# Patient Record
Sex: Female | Born: 1995 | Race: Black or African American | Hispanic: No | Marital: Single | Smoking: Never smoker
Health system: Southern US, Community
[De-identification: ages and names within clinical notes are randomized; demographics above are authoritative.]

## PROBLEM LIST (undated history)

## (undated) HISTORY — PX: WISDOM TOOTH EXTRACTION: SHX21

---
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
## 2015-09-27 ENCOUNTER — Emergency Department
Admission: EM | Admit: 2015-09-27 | Discharge: 2015-09-27 | Disposition: A | Discharge status: Home / Self Care | Payer: Medicaid Other | Attending: Emergency Medicine | Admitting: Emergency Medicine

## 2015-09-27 ENCOUNTER — Encounter: Payer: Self-pay | Admitting: *Deleted

## 2015-09-27 DIAGNOSIS — Z880 Allergy status to penicillin: Secondary | ICD-10-CM | POA: Insufficient documentation

## 2015-09-27 DIAGNOSIS — F419 Anxiety disorder, unspecified: Secondary | ICD-10-CM | POA: Insufficient documentation

## 2015-09-27 DIAGNOSIS — R42 Dizziness and giddiness: Secondary | ICD-10-CM | POA: Insufficient documentation

## 2015-09-27 LAB — BASIC METABOLIC PANEL
Anion gap: 4 — ABNORMAL LOW (ref 5–15)
BUN: 11 mg/dL (ref 6–20)
CALCIUM: 8.3 mg/dL — AB (ref 8.9–10.3)
CO2: 24 mmol/L (ref 22–32)
CREATININE: 1.03 mg/dL — AB (ref 0.44–1.00)
Chloride: 111 mmol/L (ref 101–111)
GFR calc Af Amer: >60 mL/min (ref >60)
GFR calc non Af Amer: >60 mL/min (ref >60)
GLUCOSE: 101 mg/dL — AB (ref 65–99)
Potassium: 4.1 mmol/L (ref 3.5–5.1)
Sodium: 139 mmol/L (ref 135–145)

## 2015-09-27 LAB — CBC
HCT: 39.6 % (ref 35.0–47.0)
HEMOGLOBIN: 12.8 g/dL (ref 12.0–16.0)
MCH: 27.6 pg (ref 26.0–34.0)
MCHC: 32.4 g/dL (ref 32.0–36.0)
MCV: 85.3 fL (ref 80.0–100.0)
PLATELETS: 205 10*3/uL (ref 150–440)
RBC: 4.64 MIL/uL (ref 3.80–5.20)
RDW: 12.4 % (ref 11.5–14.5)
WBC: 5 10*3/uL (ref 3.6–11.0)

## 2015-09-27 NOTE — ED Provider Notes (Signed)
[facility] Emergency Department Provider Note  ____________________________________________  Time seen: On arrival  I have reviewed the triage vital signs and the nursing notes.   HISTORY  Chief Complaint Weakness    HPI [name] is a 19 y.o. female who reports feeling weak and dizzy. She notes that she gave plasma earlier today and since then has felt fatigued. She is worried that she is often much blood. She denies chest pain or palpitations. She notes that she has felt dizzy when standing rapidly but is fine lying in bed. She is never experienced this before after donating plasma which she apparently does relatively regularly     No past medical history on file.  There are no active problems to display for this patient.   No past surgical history on file.  No current outpatient prescriptions on file.  Allergies Penicillins  No family history on file.  Social History Social History  Substance Use Topics  . Smoking status: Never Smoker   . Smokeless tobacco: Not on file  . Alcohol Use: No    Review of Systems  Constitutional: Negative for fever. Eyes: Negative for visual changes. ENT: Negative for sore throat Cardiovascular: Negative for chest pain. Respiratory: Negative for shortness of breath. Gastrointestinal: Negative for abdominal pain, vomiting and diarrhea. Genitourinary: Negative for dysuria. Musculoskeletal: Negative for back pain. Skin: Negative for rash. Neurological: Negative for headaches or focal weakness Psychiatric:mild anxiety    ____________________________________________   PHYSICAL EXAM:  VITAL SIGNS: ED Triage Vitals  Enc Vitals Group     BP 09/27/15 2024 124/69 mmHg     Pulse Rate 09/27/15 2024 87     Resp 09/27/15 2024 20     Temp 09/27/15 2024 98.5 F (36.9 C)     Temp Source 09/27/15 2024 Oral     SpO2 09/27/15 2024 98 %     Weight 09/27/15 2024 170 lb (77.111 kg)     Height 09/27/15  2024 5\' 5"  (1.651 m)     Head Cir --      Peak Flow --      Pain Score 09/27/15 2119 7     Pain Loc --      Pain Edu? --      Excl. in GC? --      Constitutional: Alert and oriented. Well appearing and in no distress. Eyes: Conjunctivae are normal.  ENT   Head: Normocephalic and atraumatic.   Mouth/Throat: Mucous membranes are moist. Cardiovascular: Normal rate, regular rhythm. Normal and symmetric distal pulses are present in all extremities. No murmurs, rubs, or gallops. Respiratory: Normal respiratory effort without tachypnea nor retractions. Breath sounds are clear and equal bilaterally.  Gastrointestinal: Soft and non-tender in all quadrants. No distention. There is no CVA tenderness. Genitourinary: deferred Musculoskeletal: Nontender with normal range of motion in all extremities. No lower extremity tenderness nor edema. Neurologic:  Normal speech and language. No gross focal neurologic deficits are appreciated. Skin:  Skin is warm, dry and intact. No rash noted. Psychiatric: Mood and affect are normal. Patient exhibits appropriate insight and judgment.  ____________________________________________    LABS (pertinent positives/negatives)  Labs Reviewed  BASIC METABOLIC PANEL - Abnormal; Notable for the following:    Glucose, Bld 101 (*)    Creatinine, Ser 1.03 (*)    Calcium 8.3 (*)    Anion gap 4 (*)    All other components within normal limits  CBC    ____________________________________________   EKG  None  ____________________________________________  RADIOLOGY I have personally reviewed any xrays that were ordered on this patient: None  ____________________________________________   PROCEDURES  Procedure(s) performed: none  Critical Care performed: none  ____________________________________________   INITIAL IMPRESSION / ASSESSMENT AND PLAN / ED COURSE  Pertinent labs & imaging results that were available during my care of the  patient were reviewed by me and considered in my medical decision making (see chart for details).  Patient well-appearing and in no distress. Vital signs are normal here. Dizziness likely related to blood product donation today.She is not tachycardic and her blood pressure is normal. I do not think labs are necessary. Reassurance provided and I encouraged the patient to drink orange juice and eat  ____________________________________________   FINAL CLINICAL IMPRESSION(S) / ED DIAGNOSES  Final diagnoses:  Dizziness     Jene Every, MD 09/27/15 2312

## 2015-09-27 NOTE — ED Notes (Signed)
Pt reports giving blood today and now feels weak and feels faint.  Pt gave blood at 1400 today.  No n/v/d.  Pt alert.  Speech clear.

## 2015-09-27 NOTE — Discharge Instructions (Signed)

## 2015-12-04 ENCOUNTER — Emergency Department
Admission: EM | Admit: 2015-12-04 | Discharge: 2015-12-04 | Disposition: A | Discharge status: Home / Self Care | Payer: Medicaid Other | Attending: Emergency Medicine | Admitting: Emergency Medicine

## 2015-12-04 ENCOUNTER — Encounter: Payer: Self-pay | Admitting: Emergency Medicine

## 2015-12-04 DIAGNOSIS — Z880 Allergy status to penicillin: Secondary | ICD-10-CM | POA: Insufficient documentation

## 2015-12-04 DIAGNOSIS — J0100 Acute maxillary sinusitis, unspecified: Secondary | ICD-10-CM | POA: Insufficient documentation

## 2015-12-04 DIAGNOSIS — Z79899 Other long term (current) drug therapy: Secondary | ICD-10-CM | POA: Insufficient documentation

## 2015-12-04 MED ORDER — SULFAMETHOXAZOLE-TRIMETHOPRIM 800-160 MG PO TABS: 1.0000 | ORAL_TABLET | Freq: Two times a day (BID) | ORAL | Status: DC

## 2015-12-04 MED ORDER — BENZONATATE 100 MG PO CAPS: 100.0000 mg | ORAL_CAPSULE | Freq: Three times a day (TID) | ORAL | Status: DC | PRN

## 2015-12-04 MED ORDER — IBUPROFEN 800 MG PO TABS: 800.0000 mg | ORAL_TABLET | Freq: Three times a day (TID) | ORAL | Status: DC | PRN

## 2015-12-04 MED ORDER — FEXOFENADINE-PSEUDOEPHED ER 60-120 MG PO TB12: 1.0000 | ORAL_TABLET | Freq: Two times a day (BID) | ORAL | Status: DC

## 2015-12-04 NOTE — ED Provider Notes (Signed)
[facility] Emergency Department Provider Note  ____________________________________________  Time seen: Approximately 12:55 PM  I have reviewed the triage vital signs and the nursing notes.   HISTORY  Chief Complaint Cough    HPI [name] is a 19 y.o. female patient complaining of nasal congestion, cough,chest congestion and sneezing and body aches for 3 weeks. Patient states she is tired using over-the-counter cold preparation which has not helped.He denies any fever associated this complaint patient denies any nausea vomiting or diarrhea.   History reviewed. No pertinent past medical history.  There are no active problems to display for this patient.   History reviewed. No pertinent past surgical history.  Current Outpatient Rx  Name  Route  Sig  Dispense  Refill  . benzonatate (TESSALON PERLES) 100 MG capsule   Oral   Take 1 capsule (100 mg total) by mouth 3 (three) times daily as needed for cough.   15 capsule   0   . fexofenadine-pseudoephedrine (ALLEGRA-D) 60-120 MG 12 hr tablet   Oral   Take 1 tablet by mouth 2 (two) times daily.   30 tablet   0   . ibuprofen (ADVIL,MOTRIN) 800 MG tablet   Oral   Take 1 tablet (800 mg total) by mouth every 8 (eight) hours as needed.   30 tablet   0   . sulfamethoxazole-trimethoprim (BACTRIM DS,SEPTRA DS) 800-160 MG tablet   Oral   Take 1 tablet by mouth 2 (two) times daily.   20 tablet   0     Allergies Penicillins  History reviewed. No pertinent family history.  Social History Social History  Substance Use Topics  . Smoking status: Never Smoker   . Smokeless tobacco: None  . Alcohol Use: No  Review of Systems Constitutional: No fever/chills Eyes: No visual changes. ENT: Sore throat. Nasal congestion and thick nasal discharge. Cardiovascular: Denies chest pain. Respiratory: Denies shortness of breath. Gastrointestinal: No abdominal pain.  No nausea, no vomiting.  No  diarrhea.  No constipation. Genitourinary: Negative for dysuria. Musculoskeletal: Negative for back pain. Skin: Negative for rash. Neurological: Negative for headaches, focal weakness or numbness. 0-point ROS otherwise negative.  ____________________________________________   PHYSICAL EXAM:  VITAL SIGNS: ED Triage Vitals  Enc Vitals Group     BP 12/04/15 1212 127/68 mmHg     Pulse Rate 12/04/15 1212 84     Resp 12/04/15 1212 20     Temp 12/04/15 1212 98.4 F (36.9 C)     Temp Source 12/04/15 1212 Oral     SpO2 12/04/15 1212 100 %     Weight 12/04/15 1212 178 lb (80.74 kg)     Height 12/04/15 1212 5\' 6"  (1.676 m)     Head Cir --      Peak Flow --      Pain Score 12/04/15 1213 7     Pain Loc --      Pain Edu? --      Excl. in GC? --    Constitutional: Alert and oriented. Well appearing and in no acute distress. Eyes: Conjunctivae are normal. PERRL. EOMI. Head: Atraumatic. Nose: Bilateral maxillary guarding. Bilaterally edematous nasal turbinates. Thick nasal discharge Mouth/Throat: Mucous membranes are moist.  Oropharynx erythematous. Neck: No stridor.  No cervical spine tenderness to palpation. Hematological/Lymphatic/Immunilogical: No cervical lymphadenopathy. Cardiovascular: Normal rate, regular rhythm. Grossly normal heart sounds.  Good peripheral circulation. Respiratory: Normal respiratory effort.  No retractions. Lungs CTAB. Gastrointestinal: Soft and nontender. No distention. No abdominal bruits.  No CVA tenderness. Musculoskeletal: No lower extremity tenderness nor edema.  No joint effusions. Neurologic:  Normal speech and language. No gross focal neurologic deficits are appreciated. No gait instability. Skin:  Skin is warm, dry and intact. No rash noted. Psychiatric: Mood and affect are normal. Speech and behavior are normal.  ____________________________________________   LABS (all labs ordered are listed, but only abnormal results are displayed)  Labs  Reviewed - No data to display ____________________________________________  EKG   ____________________________________________  RADIOLOGY   ____________________________________________   PROCEDURES  Procedure(s) performed: None  Critical Care performed: No  ____________________________________________   INITIAL IMPRESSION / ASSESSMENT AND PLAN / ED COURSE  Pertinent labs & imaging results that were available during my care of the patient were reviewed by me and considered in my medical decision making (see chart for details).  Bilateral maxillary sinusitis. She is given discharge care instructions. Patient given a prescription for Bactrim, Allegra-D, ibuprofen, and Tessalon Perles. Advised to [facility]. ____________________________________________   FINAL CLINICAL IMPRESSION(S) / ED DIAGNOSES  Final diagnoses:  Subacute maxillary sinusitis      Joni Reining, PA-C 12/04/15 1306  Jeanmarie Plant, MD 12/07/15 1041

## 2015-12-04 NOTE — ED Notes (Signed)
Reports cough, congestion, sneezing, and bodyaches x 3 weeks.  No resp ditress at this time.

## 2015-12-04 NOTE — Discharge Instructions (Signed)
Sinusitis, Adult  Sinusitis is redness, soreness, and puffiness (inflammation) of the air pockets in the bones of your face (sinuses). The redness, soreness, and puffiness can cause air and mucus to get trapped in your sinuses. This can allow germs to grow and cause an infection.   HOME CARE    Drink enough fluids to keep your pee (urine) clear or pale yellow.   Use a humidifier in your home.   Run a hot shower to create steam in the bathroom. Sit in the bathroom with the door closed. Breathe in the steam 3-4 times a day.   Put a warm, moist washcloth on your face 3-4 times a day, or as told by your doctor.   Use salt water sprays (saline sprays) to wet the thick fluid in your nose. This can help the sinuses drain.   Only take medicine as told by your doctor.  GET HELP RIGHT AWAY IF:    Your pain gets worse.   You have very bad headaches.   You are sick to your stomach (nauseous).   You throw up (vomit).   You are very sleepy (drowsy) all the time.   Your face is puffy (swollen).   Your vision changes.   You have a stiff neck.   You have trouble breathing.  MAKE SURE YOU:    Understand these instructions.   Will watch your condition.   Will get help right away if you are not doing well or get worse.     This information is not intended to replace advice given to you by your health care provider. Make sure you discuss any questions you have with your health care provider.     Document Released: 05/08/2008 Document Revised: 12/11/2014 Document Reviewed: 06/25/2012  Elsevier Interactive Patient Education 2016 [employer]

## 2015-12-25 ENCOUNTER — Emergency Department
Admission: EM | Admit: 2015-12-25 | Discharge: 2015-12-25 | Disposition: A | Discharge status: Home / Self Care | Payer: Medicaid Other | Attending: Student | Admitting: Student

## 2015-12-25 ENCOUNTER — Encounter: Payer: Self-pay | Admitting: Emergency Medicine

## 2015-12-25 DIAGNOSIS — Z880 Allergy status to penicillin: Secondary | ICD-10-CM | POA: Diagnosis not present

## 2015-12-25 DIAGNOSIS — M79604 Pain in right leg: Secondary | ICD-10-CM | POA: Diagnosis present

## 2015-12-25 DIAGNOSIS — M79652 Pain in left thigh: Secondary | ICD-10-CM | POA: Insufficient documentation

## 2015-12-25 DIAGNOSIS — Z79899 Other long term (current) drug therapy: Secondary | ICD-10-CM | POA: Insufficient documentation

## 2015-12-25 DIAGNOSIS — Z792 Long term (current) use of antibiotics: Secondary | ICD-10-CM | POA: Diagnosis not present

## 2015-12-25 DIAGNOSIS — M79651 Pain in right thigh: Secondary | ICD-10-CM | POA: Insufficient documentation

## 2015-12-25 LAB — COMPREHENSIVE METABOLIC PANEL
ALBUMIN: 4.4 g/dL (ref 3.5–5.0)
ALT: 24 U/L (ref 14–54)
AST: 31 U/L (ref 15–41)
Alkaline Phosphatase: 83 U/L (ref 38–126)
Anion gap: 5 (ref 5–15)
BILIRUBIN TOTAL: 0.4 mg/dL (ref 0.3–1.2)
BUN: 15 mg/dL (ref 6–20)
CHLORIDE: 108 mmol/L (ref 101–111)
CO2: 27 mmol/L (ref 22–32)
CREATININE: 0.71 mg/dL (ref 0.44–1.00)
Calcium: 9.5 mg/dL (ref 8.9–10.3)
GFR calc Af Amer: >60 mL/min (ref >60)
GFR calc non Af Amer: >60 mL/min (ref >60)
GLUCOSE: 68 mg/dL (ref 65–99)
POTASSIUM: 4.4 mmol/L (ref 3.5–5.1)
Sodium: 140 mmol/L (ref 135–145)
TOTAL PROTEIN: 8.2 g/dL — AB (ref 6.5–8.1)

## 2015-12-25 LAB — URINALYSIS COMPLETE WITH MICROSCOPIC (ARMC ONLY)
Bilirubin Urine: NEGATIVE
Glucose, UA: NEGATIVE mg/dL
Hgb urine dipstick: NEGATIVE
Ketones, ur: NEGATIVE mg/dL
LEUKOCYTES UA: NEGATIVE
Nitrite: NEGATIVE
PH: 6 (ref 5.0–8.0)
PROTEIN: NEGATIVE mg/dL
Specific Gravity, Urine: 1.019 (ref 1.005–1.030)

## 2015-12-25 LAB — CBC WITH DIFFERENTIAL/PLATELET
BASOS ABS: 0 10*3/uL (ref 0–0.1)
BASOS PCT: 0 %
Eosinophils Absolute: 0 10*3/uL (ref 0–0.7)
Eosinophils Relative: 1 %
HEMATOCRIT: 38.4 % (ref 35.0–47.0)
HEMOGLOBIN: 12.6 g/dL (ref 12.0–16.0)
Lymphocytes Relative: 36 %
Lymphs Abs: 1.2 10*3/uL (ref 1.0–3.6)
MCH: 27 pg (ref 26.0–34.0)
MCHC: 32.9 g/dL (ref 32.0–36.0)
MCV: 82 fL (ref 80.0–100.0)
MONO ABS: 0.4 10*3/uL (ref 0.2–0.9)
Monocytes Relative: 12 %
NEUTROS ABS: 1.6 10*3/uL (ref 1.4–6.5)
NEUTROS PCT: 51 %
Platelets: 236 10*3/uL (ref 150–440)
RBC: 4.68 MIL/uL (ref 3.80–5.20)
RDW: 12.6 % (ref 11.5–14.5)
WBC: 3.3 10*3/uL — AB (ref 3.6–11.0)

## 2015-12-25 MED ORDER — SODIUM CHLORIDE 0.9 % IV BOLUS (SEPSIS)
1000.0000 mL | Freq: Once | INTRAVENOUS | Status: AC
  Administered 2015-12-25: 1000 mL via INTRAVENOUS

## 2015-12-25 MED ORDER — NAPROXEN 500 MG PO TABS: 500.0000 mg | ORAL_TABLET | Freq: Two times a day (BID) | ORAL | Status: DC

## 2015-12-25 MED ORDER — KETOROLAC TROMETHAMINE 30 MG/ML IJ SOLN
30.0000 mg | Freq: Once | INTRAMUSCULAR | Status: AC
  Administered 2015-12-25: 30 mg via INTRAVENOUS
  Filled 2015-12-25: qty 1

## 2015-12-25 NOTE — Discharge Instructions (Signed)
Musculoskeletal Pain Musculoskeletal pain is muscle and boney aches and pains. These pains can occur in any part of the body. Your caregiver may treat you without knowing the cause of the pain. They may treat you if blood or urine tests, X-rays, and other tests were normal.  CAUSES There is often not a definite cause or reason for these pains. These pains may be caused by a type of germ (virus). The discomfort may also come from overuse. Overuse includes working out too hard when your body is not fit. Boney aches also come from weather changes. Bone is sensitive to atmospheric pressure changes. HOME CARE INSTRUCTIONS   Ask when your test results will be ready. Make sure you get your test results.  Only take over-the-counter or prescription medicines for pain, discomfort, or fever as directed by your caregiver. If you were given medications for your condition, do not drive, operate machinery or power tools, or sign legal documents for 24 hours. Do not drink alcohol. Do not take sleeping pills or other medications that may interfere with treatment.  Continue all activities unless the activities cause more pain. When the pain lessens, slowly resume normal activities. Gradually increase the intensity and duration of the activities or exercise.  During periods of severe pain, bed rest may be helpful. Lay or sit in any position that is comfortable.  Putting ice on the injured area.  Put ice in a bag.  Place a towel between your skin and the bag.  Leave the ice on for 15 to 20 minutes, 3 to 4 times a day.  Follow up with your caregiver for continued problems and no reason can be found for the pain. If the pain becomes worse or does not go away, it may be necessary to repeat tests or do additional testing. Your caregiver may need to look further for a possible cause. SEEK IMMEDIATE MEDICAL CARE IF:  You have pain that is getting worse and is not relieved by medications.  You develop chest pain  that is associated with shortness or breath, sweating, feeling sick to your stomach (nauseous), or throw up (vomit).  Your pain becomes localized to the abdomen.  You develop any new symptoms that seem different or that concern you. MAKE SURE YOU:   Understand these instructions.  Will watch your condition.  Will get help right away if you are not doing well or get worse.   This information is not intended to replace advice given to you by your health care provider. Make sure you discuss any questions you have with your health care provider.   Document Released: 11/20/2005 Document Revised: 02/12/2012 Document Reviewed: 07/25/2013 Elsevier Interactive Patient Education 2016 [employer].   Follow-up with [place] [facility] if any continued problems. Begin taking naproxen twice a day with food and discontinue exercise at this time. You may do upper body exercises only. Try ice or heat to your legs to see if this helps with your discomfort.

## 2015-12-25 NOTE — ED Notes (Signed)
Pt reports has been working out every day over last week.  Has been having pain in thighs since then but reports the pain hasn't seemed to ease.  Also started having pain in right knee; previously has acl repair in this knee.

## 2015-12-25 NOTE — ED Provider Notes (Signed)
[facility] Emergency Department Provider Note  ____________________________________________  Time seen: Approximately 1:13 PM  I have reviewed the triage vital signs and the nursing notes.   HISTORY  Chief Complaint Leg Pain  HPI [name] is a 20 y.o. female complaint of bilateral leg pain. Patient states that she is having great deal of pain in her thighs bilaterally without any known injury. Patient states that she has a exercise program that she does for 30 minutes every day consisting of squats and one just. She has been taking over-the-counter ibuprofen without any relief.She rates her pain as 3 out of 10 however when she tries to walk is 10 over 10.   History reviewed. No pertinent past medical history.  There are no active problems to display for this patient.   History reviewed. No pertinent past surgical history.  Current Outpatient Rx  Name  Route  Sig  Dispense  Refill  . benzonatate (TESSALON PERLES) 100 MG capsule   Oral   Take 1 capsule (100 mg total) by mouth 3 (three) times daily as needed for cough.   15 capsule   0   . fexofenadine-pseudoephedrine (ALLEGRA-D) 60-120 MG 12 hr tablet   Oral   Take 1 tablet by mouth 2 (two) times daily.   30 tablet   0   . naproxen (NAPROSYN) 500 MG tablet   Oral   Take 1 tablet (500 mg total) by mouth 2 (two) times daily with a meal.   30 tablet   0   . sulfamethoxazole-trimethoprim (BACTRIM DS,SEPTRA DS) 800-160 MG tablet   Oral   Take 1 tablet by mouth 2 (two) times daily.   20 tablet   0     Allergies Penicillins  History reviewed. No pertinent family history.  Social History Social History  Substance Use Topics  . Smoking status: Never Smoker   . Smokeless tobacco: None  . Alcohol Use: No    Review of Systems Constitutional: No fever/chills ENT: No sore throat. Cardiovascular: Denies chest pain. Respiratory: Denies shortness of breath. Gastrointestinal: No  abdominal pain.  No nausea, no vomiting.  No diarrhea.  No constipation. Genitourinary: Negative for dysuria. Musculoskeletal: Negative for back pain. Also bilateral leg pain Skin: Negative for rash. Neurological: Negative for headaches, focal weakness or numbness.  10-point ROS otherwise negative.  ____________________________________________   PHYSICAL EXAM:  VITAL SIGNS: ED Triage Vitals  Enc Vitals Group     BP 12/25/15 1300 131/69 mmHg     Pulse Rate 12/25/15 1300 86     Resp 12/25/15 1300 16     Temp 12/25/15 1300 98.3 F (36.8 C)     Temp Source 12/25/15 1300 Oral     SpO2 12/25/15 1300 97 %     Weight 12/25/15 1300 170 lb (77.111 kg)     Height 12/25/15 1300  (1.676 m)     Head Cir --      Peak Flow --      Pain Score 12/25/15 1300 3     Pain Loc --      Pain Edu? --      Excl. in GC? --     Constitutional: Alert and oriented. Well appearing and in no acute distress. Eyes: Conjunctivae are normal. PERRL. EOMI. Head: Atraumatic. Nose: No congestion/rhinnorhea. Neck: No stridor.   Cardiovascular: Normal rate, regular rhythm. Grossly normal heart sounds.  Good peripheral circulation. Respiratory: Normal respiratory effort.  No retractions. Lungs CTAB. Gastrointestinal: Soft and nontender.  No distention.  Musculoskeletal: Examination of the legs bilaterally there is no gross deformity. Skin is soft with minimal tenderness on palpation. Range of motion is without restrictions however this does increase patient's pain. There is no WARMTH or redness and there is no soft tissue edema present. Pulses are present bilaterally and motor sensory function intact. Neurologic:  Normal speech and language. No gross focal neurologic deficits are appreciated. No gait instability. Skin:  Skin is warm, dry and intact. No rash noted. Psychiatric: Mood and affect are normal. Speech and behavior are normal.  ____________________________________________   LABS (all labs ordered  are listed, but only abnormal results are displayed)  Labs Reviewed  URINALYSIS COMPLETEWITH MICROSCOPIC ([facility] ONLY) - Abnormal; Notable for the following:    Color, Urine YELLOW (*)    APPearance CLEAR (*)    Bacteria, UA RARE (*)    Squamous Epithelial / LPF 0-5 (*)    All other components within normal limits  CBC WITH DIFFERENTIAL/PLATELET - Abnormal; Notable for the following:    WBC 3.3 (*)    All other components within normal limits  COMPREHENSIVE METABOLIC PANEL - Abnormal; Notable for the following:    Total Protein 8.2 (*)    All other components within normal limits   ____________________________________________  PROCEDURES  Procedure(s) performed: None  Critical Care performed: No  ____________________________________________   INITIAL IMPRESSION / ASSESSMENT AND PLAN / ED COURSE  Pertinent labs & imaging results that were available during my care of the patient were reviewed by me and considered in my medical decision making (see chart for details).  Patient was given Toradol 30 mg IV while in the emergency room and reassured that her labs are completely normal and she does not have rhabdomyolysis. He is follow-up with [facility] if any continued problems. He also discussed decreasing her workout and for the next week not using lower body but exercising upper body until her legs are feeling better. ____________________________________________   FINAL CLINICAL IMPRESSION(S) / ED DIAGNOSES  Final diagnoses:  Bilateral thigh pain      Tommi Rumps, PA-C 12/25/15 1459  Tommi Rumps, PA-C 12/25/15 1515  Gayla Doss, MD 12/25/15 1555

## 2016-02-14 ENCOUNTER — Emergency Department
Admission: EM | Admit: 2016-02-14 | Discharge: 2016-02-14 | Disposition: A | Discharge status: Home / Self Care | Payer: Medicaid Other | Attending: Emergency Medicine | Admitting: Emergency Medicine

## 2016-02-14 ENCOUNTER — Encounter: Payer: Self-pay | Admitting: *Deleted

## 2016-02-14 DIAGNOSIS — J0100 Acute maxillary sinusitis, unspecified: Secondary | ICD-10-CM | POA: Insufficient documentation

## 2016-02-14 DIAGNOSIS — Z880 Allergy status to penicillin: Secondary | ICD-10-CM | POA: Insufficient documentation

## 2016-02-14 MED ORDER — PSEUDOEPH-BROMPHEN-DM 30-2-10 MG/5ML PO SYRP: 10.0000 mL | ORAL_SOLUTION | Freq: Four times a day (QID) | ORAL | Status: DC | PRN

## 2016-02-14 MED ORDER — AZITHROMYCIN 250 MG PO TABS: ORAL_TABLET | ORAL | Status: DC

## 2016-02-14 NOTE — ED Notes (Signed)
Pt complains of congestion and a sore throat starting on 02/10/16

## 2016-02-14 NOTE — Discharge Instructions (Signed)

## 2016-02-14 NOTE — ED Provider Notes (Signed)
[facility] Emergency Department Provider Note  ____________________________________________  Time seen: Approximately 12:16 PM  I have reviewed the triage vital signs and the nursing notes.   HISTORY  Chief Complaint Nasal Congestion    HPI [name] is a 20 y.o. female, NAD, reports the emergency department with 1 week history of nasal congestion, sore throat, headache. Was seen in an outpatient urgent care about one week ago and told she had a viral infection. Has was given a medication at that time but uncertain what it is. Did not bring it with her today. Pressure and has frontal headache have been worsening over the last couple of days. Has not been taking anything over-the-counter for current symptoms. Denies body aches.   History reviewed. No pertinent past medical history.  There are no active problems to display for this patient.   History reviewed. No pertinent past surgical history.  Current Outpatient Rx  Name  Route  Sig  Dispense  Refill  . azithromycin (ZITHROMAX Z-PAK) 250 MG tablet      Take 2 tablets (500 mg) on  Day 1,  followed by 1 tablet (250 mg) once daily on Days 2 through 5.   6 each   0   . brompheniramine-pseudoephedrine-DM 30-2-10 MG/5ML syrup   Oral   Take 10 mLs by mouth 4 (four) times daily as needed.   200 mL   0     Allergies Penicillins  No family history on file.  Social History Social History  Substance Use Topics  . Smoking status: Never Smoker   . Smokeless tobacco: None  . Alcohol Use: No     Review of Systems  Constitutional: Positive fever/chills Eyes: No visual changes. No discharge, redness, swelling. ENT: Positive nasal congestion, sinus pressure, sore throat. Cardiovascular: No chest pain. Respiratory: As of cough. No shortness of breath. No wheezing.  Gastrointestinal: No abdominal pain.  No nausea, vomiting.  No diarrhea.   Musculoskeletal: Positive general myalgias that have  resolved. Negative for back pain.  Skin: Negative for rash. Neurological: Negative for headaches, focal weakness or numbness. 10-point ROS otherwise negative.  ____________________________________________   PHYSICAL EXAM:  VITAL SIGNS: ED Triage Vitals  Enc Vitals Group     BP 02/14/16 1117 119/77 mmHg     Pulse Rate 02/14/16 1117 72     Resp 02/14/16 1117 20     Temp 02/14/16 1117 99.5 F (37.5 C)     Temp Source 02/14/16 1117 Oral     SpO2 02/14/16 1117 98 %     Weight 02/14/16 1117 178 lb (80.74 kg)     Height 02/14/16 1117 5\' 6"  (1.676 m)     Head Cir --      Peak Flow --      Pain Score --      Pain Loc --      Pain Edu? --      Excl. in GC? --     Constitutional: Alert and oriented. Well appearing and in no acute distress. Eyes: Conjunctivae are normal. PERRL. EOMI without pain.  Head: Atraumatic. ENT:      Ears: TMs visualized bilaterally with moderate serous effusions but no perforation, bulging, erythema.      Nose: Mild congestion/rhinnorhea.      Mouth/Throat: Mucous membranes are moist. Nares with mild injection but no swelling or exudates. Neck: Supple with full range of motion. Hematological/Lymphatic/Immunilogical: No cervical lymphadenopathy. Cardiovascular: Normal rate, regular rhythm. Normal S1 and S2.  Good  peripheral circulation. Respiratory: Normal respiratory effort without tachypnea or retractions. Lungs CTAB. Neurologic:  Normal speech and language. No gross focal neurologic deficits are appreciated.  Skin:  Skin is warm, dry and intact. No rash noted. Psychiatric: Mood and affect are normal. Speech and behavior are normal. Patient exhibits appropriate insight and judgement.   ____________________________________________   LABS  None  ____________________________________________  EKG  None ____________________________________________  RADIOLOGY  None  ____________________________________________    PROCEDURES  Procedure(s)  performed: None    Medications - No data to display   ____________________________________________   INITIAL IMPRESSION / ASSESSMENT AND PLAN / ED COURSE  Patient's diagnosis is consistent with acute maxillary sinusitis. Patient will be discharged home with prescriptions for azithromycin and Bromfed-DM syrup to use as directed. Patient is to follow up with [facility] if symptoms persist past this treatment course. Patient is given ED precautions to return to the ED for any worsening or new symptoms.     ____________________________________________  FINAL CLINICAL IMPRESSION(S) / ED DIAGNOSES  Final diagnoses:  Acute maxillary sinusitis, recurrence not specified      NEW MEDICATIONS STARTED DURING THIS VISIT:  New Prescriptions   AZITHROMYCIN (ZITHROMAX Z-PAK) 250 MG TABLET    Take 2 tablets (500 mg) on  Day 1,  followed by 1 tablet (250 mg) once daily on Days 2 through 5.   BROMPHENIRAMINE-PSEUDOEPHEDRINE-DM 30-2-10 MG/5ML SYRUP    Take 10 mLs by mouth 4 (four) times daily as needed.         Hope Pigeon, PA-C 02/14/16 1236  Jene Every, MD 02/14/16 1332

## 2016-05-24 ENCOUNTER — Emergency Department
Admission: EM | Admit: 2016-05-24 | Discharge: 2016-05-24 | Disposition: A | Discharge status: Home / Self Care | Payer: Medicaid Other | Attending: Emergency Medicine | Admitting: Emergency Medicine

## 2016-05-24 ENCOUNTER — Encounter: Payer: Self-pay | Admitting: Emergency Medicine

## 2016-05-24 DIAGNOSIS — H18822 Corneal disorder due to contact lens, left eye: Secondary | ICD-10-CM | POA: Diagnosis not present

## 2016-05-24 DIAGNOSIS — H5712 Ocular pain, left eye: Secondary | ICD-10-CM | POA: Diagnosis present

## 2016-05-24 MED ORDER — TETRACAINE HCL 0.5 % OP SOLN
2.0000 [drp] | Freq: Once | OPHTHALMIC | Status: AC
  Administered 2016-05-24: 2 [drp] via OPHTHALMIC
  Filled 2016-05-24: qty 2

## 2016-05-24 MED ORDER — FLUORESCEIN SODIUM 1 MG OP STRP
ORAL_STRIP | OPHTHALMIC | Status: AC
Start: 2016-05-24 — End: 2016-05-24
  Administered 2016-05-24: 1 via OPHTHALMIC
  Filled 2016-05-24: qty 1

## 2016-05-24 MED ORDER — TETRACAINE HCL 0.5 % OP SOLN
OPHTHALMIC | Status: AC
Start: 2016-05-24 — End: 2016-05-24
  Administered 2016-05-24: 2 [drp] via OPHTHALMIC
  Filled 2016-05-24: qty 2

## 2016-05-24 MED ORDER — FLUORESCEIN SODIUM 1 MG OP STRP
1.0000 | ORAL_STRIP | Freq: Once | OPHTHALMIC | Status: AC
  Administered 2016-05-24: 1 via OPHTHALMIC
  Filled 2016-05-24: qty 1

## 2016-05-24 MED ORDER — SULFACETAMIDE SODIUM 10 % OP SOLN: 2.0000 [drp] | OPHTHALMIC | Status: AC

## 2016-05-24 NOTE — ED Provider Notes (Signed)
[facility] Emergency Department Provider Note ____________________________________________  Time seen: Approximately 1:23 PM  I have reviewed the triage vital signs and the nursing notes.   HISTORY  Chief Complaint Eye Pain   HPI [name] is a 20 y.o. female who arrives to the emergency department with complaint of left eye pain x 3 days. Symptoms began with itching which progressed into tolerable irritation. Yesterday patient states the irritation worsened and prevented her from sleeping well. This morning she notes extreme photophobia, copious clear discharge, and a dull aching pain. Pain is rated at 8/10. She attempted to flush her eye in the shower with cold water and has been using "blink and clear lens drops" without relief. Says she feels as if the pain is on the top/back of her eye and that there is a grinding sensation. Denies any trauma to the area or any clear moment where a foreign body entered her eye. Denies periocular edema, erythema and warmth, diplopia and increased blurred vision. States that she "knows pink eye, and this is not pink eye" and has not had any contacts with this. Has not noticed any crusting on the eyelids or having difficulty opening her eyelid in the morning.   History reviewed. No pertinent past medical history.  There are no active problems to display for this patient.   Past Surgical History  Procedure Laterality Date  . Wisdom tooth extraction      Current Outpatient Rx  Name  Route  Sig  Dispense  Refill  . azithromycin (ZITHROMAX Z-PAK) 250 MG tablet      Take 2 tablets (500 mg) on  Day 1,  followed by 1 tablet (250 mg) once daily on Days 2 through 5.   6 each   0   . brompheniramine-pseudoephedrine-DM 30-2-10 MG/5ML syrup   Oral   Take 10 mLs by mouth 4 (four) times daily as needed.   200 mL   0   . sulfacetamide (BLEPH-10) 10 % ophthalmic solution   Left Eye   Place 2 drops into the left eye every 2  (two) hours while awake. After 2 days, 2 drops every 4 hours x 5 days.   5 mL   0     Allergies Tomato; Nickel; and Penicillins  No family history on file.  Social History Social History  Substance Use Topics  . Smoking status: Never Smoker   . Smokeless tobacco: None  . Alcohol Use: No    Review of Systems   Constitutional: No fever/chills Eyes: Negative for visual changes. Positive for pain. Musculoskeletal: Negative for pain. Skin: Negative for rash. Neurological: Negative for headaches, focal weakness or numbness. Allergic: Negative for seasonal allergies. ____________________________________________  PHYSICAL EXAM:  VITAL SIGNS: ED Triage Vitals  Enc Vitals Group     BP 05/24/16 1243 122/74 mmHg     Pulse Rate 05/24/16 1243 75     Resp 05/24/16 1243 18     Temp 05/24/16 1243 98.9 F (37.2 C)     Temp Source 05/24/16 1243 Oral     SpO2 05/24/16 1243 100 %     Weight 05/24/16 1243 175 lb (79.379 kg)     Height 05/24/16 1243 5\' 5"  (1.651 m)     Head Cir --      Peak Flow --      Pain Score 05/24/16 1248 8     Pain Loc --      Pain Edu? --      Excl.  in GC? --     Constitutional: Alert and oriented. Well appearing and in no acute distress. Eyes: Visual acuity--see nursing documentation; no globe trauma; Eyelids normal to inspection; Sclera appears anicteric.  Eyelids were inverted--no foreign bodies. Conjunctiva appears mildly injected and erythematous; Cornea with semi-circular abrasion from 10 o'clock to 1 o'clock position on fluorescein exam. Head: Atraumatic. Nose: No congestion/rhinnorhea. Mouth/Throat: Mucous membranes are moist.  Oropharynx non-erythematous. Respiratory: Respirations even and unlabored. Musculoskeletal:Normal ROM x 4 extremities. Neurologic:  Normal speech and language. No gross focal neurologic deficits are appreciated. Speech is normal. No gait instability. Skin:  Skin is warm, dry and intact. No rash noted. Psychiatric: Mood and  affect are normal. Speech and behavior are normal.  ____________________________________________   LABS (all labs ordered are listed, but only abnormal results are displayed)  Labs Reviewed - No data to display ____________________________________________  EKG   ____________________________________________  RADIOLOGY   ____________________________________________   PROCEDURES  Procedure(s) performed: None  ____________________________________________   INITIAL IMPRESSION / ASSESSMENT AND PLAN / ED COURSE  Pertinent labs & imaging results that were available during my care of the patient were reviewed by me and considered in my medical decision making (see chart for details).  Patient to receive prescriptions for Bleph 10 and advised not to wear her contact lenses for the next week.  She was advised to follow up with ophthalmology for symptoms that are not improving over the next 2-3 days. She was  also advised to return to the ER for symptoms that change or worsen if unable to schedule an appointment.  ____________________________________________   FINAL CLINICAL IMPRESSION(S) / ED DIAGNOSES  Final diagnoses:  Corneal abrasion due to contact lens, left    Note:  This document was prepared using Dragon voice recognition software and may include unintentional dictation errors.   Chinita Pester, FNP 05/24/16 1404  Minna Antis, MD 05/25/16 2007

## 2016-05-24 NOTE — ED Notes (Signed)
See this RN's triage note.  

## 2016-05-24 NOTE — ED Notes (Signed)
NAD noted at time of D/C. Pt denies questions or concerns. Pt ambulatory to the lobby at this time.  

## 2016-05-24 NOTE — ED Notes (Signed)
Pt states L eye pain x2 days. Pt states eye has been "irritating". PT states she thinks something is in her eye that feels like a small grain of salt in the "top/back area of her L eye". Pt's L eye noted to be red and irritated at this time.

## 2016-07-02 ENCOUNTER — Encounter: Payer: Self-pay | Admitting: Emergency Medicine

## 2016-07-02 ENCOUNTER — Emergency Department
Admission: EM | Admit: 2016-07-02 | Discharge: 2016-07-02 | Disposition: A | Discharge status: Home / Self Care | Payer: Medicaid Other | Attending: Emergency Medicine | Admitting: Emergency Medicine

## 2016-07-02 DIAGNOSIS — N760 Acute vaginitis: Secondary | ICD-10-CM | POA: Insufficient documentation

## 2016-07-02 DIAGNOSIS — Z113 Encounter for screening for infections with a predominantly sexual mode of transmission: Secondary | ICD-10-CM | POA: Diagnosis present

## 2016-07-02 DIAGNOSIS — B9689 Other specified bacterial agents as the cause of diseases classified elsewhere: Secondary | ICD-10-CM

## 2016-07-02 LAB — POCT PREGNANCY, URINE: Preg Test, Ur: NEGATIVE

## 2016-07-02 LAB — URINALYSIS COMPLETE WITH MICROSCOPIC (ARMC ONLY)
Bacteria, UA: NONE SEEN
Bilirubin Urine: NEGATIVE
GLUCOSE, UA: NEGATIVE mg/dL
HGB URINE DIPSTICK: NEGATIVE
Ketones, ur: NEGATIVE mg/dL
Leukocytes, UA: NEGATIVE
Nitrite: NEGATIVE
Protein, ur: NEGATIVE mg/dL
Specific Gravity, Urine: 1.018 (ref 1.005–1.030)
pH: 7 (ref 5.0–8.0)

## 2016-07-02 LAB — CHLAMYDIA/NGC RT PCR (ARMC ONLY)
CHLAMYDIA TR: DETECTED — AB
N GONORRHOEAE: NOT DETECTED

## 2016-07-02 LAB — WET PREP, GENITAL
SPERM: NONE SEEN
TRICH WET PREP: NONE SEEN
YEAST WET PREP: NONE SEEN

## 2016-07-02 MED ORDER — METRONIDAZOLE 0.75 % VA GEL: 1.0000 | Freq: Every day | VAGINAL | 0 refills | Status: DC

## 2016-07-02 NOTE — ED Provider Notes (Signed)
[facility] Emergency Department Provider Note  ____________________________________________  Time seen: Approximately 2:31 PM  I have reviewed the triage vital signs and the nursing notes.   HISTORY  Chief Complaint Exposure to STD    HPI [name] is a 20 y.o. female , NAD, presents to the emergency department for STD screening. Patient states she was notified today by her boyfriend that he has had dysuria as well as white urethral discharge over the last couple days. Patient notes last sexual intercourse with him was 2 days ago. She denies any fevers, chills, body aches, night sweats, abdominal pain, nausea, vomiting, no leg pain, vaginal discharge, dysuria, hematuria nor changes in urinary habits. Patient states she had full STD screening approximately 3 months ago with the [facility] Department and it was negative. Patient notes that her significant other has not been seen or evaluated so she is uncertain if she has been exposed to an STD. Patient is uncertain of her last menstrual period as she is currently on Depo-Provera injections and has no menses.   History reviewed. No pertinent past medical history.  There are no active problems to display for this patient.   Past Surgical History:  Procedure Laterality Date  . WISDOM TOOTH EXTRACTION      Prior to Admission medications   Medication Sig Start Date End Date Taking? Authorizing Provider  azithromycin (ZITHROMAX Z-PAK) 250 MG tablet Take 2 tablets (500 mg) on  Day 1,  followed by 1 tablet (250 mg) once daily on Days 2 through 5. 02/14/16   Jami L Hagler, PA-C  brompheniramine-pseudoephedrine-DM 30-2-10 MG/5ML syrup Take 10 mLs by mouth 4 (four) times daily as needed. 02/14/16   Jami L Hagler, PA-C  metroNIDAZOLE (METROGEL VAGINAL) 0.75 % vaginal gel Place 1 Applicatorful vaginally at bedtime. 07/02/16 07/09/16  Jami L Hagler, PA-C    Allergies Tomato; Nickel; and  Penicillins  History reviewed. No pertinent family history.  Social History Social History  Substance Use Topics  . Smoking status: Never Smoker  . Smokeless tobacco: Not on file  . Alcohol use No     Review of Systems  Constitutional: No fever/chills, fatigue, night sweats. Cardiovascular: No chest pain. Respiratory: No shortness of breath.  Gastrointestinal: No abdominal pain.  No nausea, vomiting.   Genitourinary: Negative for dysuria, hematuria, vaginal discharge, pelvic pain. No urinary hesitancy, urgency or increased frequency. Musculoskeletal: Negative for back pain.  Skin: Negative for rash, skin sores. Neurological: Negative for headaches, focal weakness or numbness. No saddle paresthesias 10-point ROS otherwise negative.  ____________________________________________   PHYSICAL EXAM:  VITAL SIGNS: ED Triage Vitals [07/02/16 1355]  Enc Vitals Group     BP (!) 116/57     Pulse Rate 84     Resp 18     Temp 98.4 F (36.9 C)     Temp Source Oral     SpO2 99 %     Weight 165 lb (74.8 kg)     Height 5\' 8"  (1.727 m)     Head Circumference      Peak Flow      Pain Score      Pain Loc      Pain Edu?      Excl. in GC?     Physical exam completed in the presence of Laqueta Jean, RN  Constitutional: Alert and oriented. Well appearing and in no acute distress. Eyes: Conjunctivae are normal without icterus or injection. Head: Atraumatic. Neck: Supple with full  range of motion. Hematological/Lymphatic/Immunilogical: No Inguinal lymphadenopathy. Cardiovascular:  Good peripheral circulation. Respiratory: Normal respiratory effort without tachypnea or retractions.  Gastrointestinal: Soft and nontender without distention or guarding. Genitourinary: External genitalia without any abnormalities. Trace amount of yellow discharge noted in the posterior vaginal vault without tenderness to palpation of the cervix. No CMT. Musculoskeletal: No lower extremity tenderness nor  edema.  No joint effusions. Neurologic:  Normal speech and language. No gross focal neurologic deficits are appreciated.  Skin:  Skin is warm, dry and intact. No rash noted. Psychiatric: Mood and affect are normal. Speech and behavior are normal. Patient exhibits appropriate insight and judgement.   ____________________________________________   LABS (all labs ordered are listed, but only abnormal results are displayed)  Labs Reviewed  CHLAMYDIA/NGC RT PCR ([facility] ONLY) - Abnormal; Notable for the following:       Result Value   Chlamydia Tr DETECTED (*)    All other components within normal limits  WET PREP, GENITAL - Abnormal; Notable for the following:    Clue Cells Wet Prep HPF POC PRESENT (*)    WBC, Wet Prep HPF POC FEW (*)    All other components within normal limits  URINALYSIS COMPLETEWITH MICROSCOPIC ([facility] ONLY) - Abnormal; Notable for the following:    Color, Urine YELLOW (*)    APPearance HAZY (*)    Squamous Epithelial / LPF 0-5 (*)    All other components within normal limits  POCT PREGNANCY, URINE  POC URINE PREG, ED   ____________________________________________  EKG  None ____________________________________________  RADIOLOGY  None ____________________________________________    PROCEDURES  Procedure(s) performed: None   Procedures   Medications - No data to display   ____________________________________________   INITIAL IMPRESSION / ASSESSMENT AND PLAN / ED COURSE  Pertinent labs & imaging results that were available during my care of the patient were reviewed by me and considered in my medical decision making (see chart for details).Gonorrhea and chlamydia cultures were not available at the time of discharge but if any positive results, we will call the patient and plan for treatment of such. Patient has an allergy to penicillins and without known exposures to STD and felt it was best to wait for results. Patient verbalizes  understanding that if her GC/Chlam results are positive but she will need to return to this emergency department or the [facility] health Department for treatment.  Clinical Course    Patient's diagnosis is consistent with Bacterial vaginosis and screening for STD. Patient will be discharged home with prescriptions for MetroGel vaginal to use as directed. Patient is to follow up with the [facility] Department if symptoms persist past this treatment course. Patient is given ED precautions to return to the ED for any worsening or new symptoms.    ____________________________________________  FINAL CLINICAL IMPRESSION(S) / ED DIAGNOSES  Final diagnoses:  Bacterial vaginosis  Screen for STD (sexually transmitted disease)      NEW MEDICATIONS STARTED DURING THIS VISIT:  Discharge Medication List as of 07/02/2016  4:01 PM    START taking these medications   Details  metroNIDAZOLE (METROGEL VAGINAL) 0.75 % vaginal gel Place 1 Applicatorful vaginally at bedtime., Starting Sun 07/02/2016, Until Sun 07/09/2016, Print        ----------------------------------------- 5:30 PM on 07/02/2016 -----------------------------------------  Patient's gonorrhea and chlamydia culture returned positive for Chlamydia and negative for gonorrhea. I called the patient via home number noted in her chart. Patient confirmed date of birth and consented to receive lab results over  the phone. Discussed results with the patient and noted that she would need to pick up a prescription that I'll call in to the pharmacy of her choice. Patient prefers to use [facility] in [place]. I will call in a prescription for azithromycin 1 g to be taken by mouth once. Patient is advised to abstain from all sexual intercourse until she is seen for recheck at the [facility] health Department to ensure infection is cleared.     Hope Pigeon, PA-C 07/02/16 1734    Minna Antis, MD 07/03/16  [phone]

## 2016-07-02 NOTE — ED Notes (Signed)
Pt here with friend stating that the person she ha been having sex with for a "long time" called her today and told her he has had discharge and burning with urination for the past couple days, pt denies any s/s of anything she states that she just wants to get checked to make sure he hasn't exposed her to something, that they have sex unprotected

## 2016-07-02 NOTE — ED Triage Notes (Signed)
Patient's boyfriend woke up with itching burning and drainage from his penis.   Patient does not display any symptoms but wants to be evaluated for exposure to STD

## 2016-07-03 ENCOUNTER — Emergency Department
Admission: EM | Admit: 2016-07-03 | Discharge: 2016-07-03 | Disposition: A | Discharge status: Home / Self Care | Payer: Medicaid Other | Attending: Emergency Medicine | Admitting: Emergency Medicine

## 2016-07-03 ENCOUNTER — Encounter: Payer: Self-pay | Admitting: Emergency Medicine

## 2016-07-03 DIAGNOSIS — Y929 Unspecified place or not applicable: Secondary | ICD-10-CM | POA: Insufficient documentation

## 2016-07-03 DIAGNOSIS — Y9389 Activity, other specified: Secondary | ICD-10-CM | POA: Diagnosis not present

## 2016-07-03 DIAGNOSIS — X58XXXA Exposure to other specified factors, initial encounter: Secondary | ICD-10-CM | POA: Diagnosis not present

## 2016-07-03 DIAGNOSIS — R112 Nausea with vomiting, unspecified: Secondary | ICD-10-CM | POA: Diagnosis not present

## 2016-07-03 DIAGNOSIS — Y999 Unspecified external cause status: Secondary | ICD-10-CM | POA: Diagnosis not present

## 2016-07-03 DIAGNOSIS — A749 Chlamydial infection, unspecified: Secondary | ICD-10-CM | POA: Insufficient documentation

## 2016-07-03 DIAGNOSIS — S39011A Strain of muscle, fascia and tendon of abdomen, initial encounter: Secondary | ICD-10-CM | POA: Diagnosis not present

## 2016-07-03 DIAGNOSIS — R1030 Lower abdominal pain, unspecified: Secondary | ICD-10-CM

## 2016-07-03 LAB — URINALYSIS COMPLETE WITH MICROSCOPIC (ARMC ONLY)
BILIRUBIN URINE: NEGATIVE
Bacteria, UA: NONE SEEN
Glucose, UA: NEGATIVE mg/dL
Hgb urine dipstick: NEGATIVE
Leukocytes, UA: NEGATIVE
NITRITE: NEGATIVE
Protein, ur: 30 mg/dL — AB
SPECIFIC GRAVITY, URINE: 1.029 (ref 1.005–1.030)
pH: 6 (ref 5.0–8.0)

## 2016-07-03 LAB — COMPREHENSIVE METABOLIC PANEL
ALK PHOS: 56 U/L (ref 38–126)
ALT: 96 U/L — AB (ref 14–54)
ANION GAP: 6 (ref 5–15)
AST: 53 U/L — ABNORMAL HIGH (ref 15–41)
Albumin: 3.9 g/dL (ref 3.5–5.0)
BILIRUBIN TOTAL: 0.8 mg/dL (ref 0.3–1.2)
BUN: 7 mg/dL (ref 6–20)
CALCIUM: 8.9 mg/dL (ref 8.9–10.3)
CO2: 27 mmol/L (ref 22–32)
CREATININE: 0.8 mg/dL (ref 0.44–1.00)
Chloride: 105 mmol/L (ref 101–111)
GFR calc non Af Amer: >60 mL/min (ref >60)
GLUCOSE: 105 mg/dL — AB (ref 65–99)
Potassium: 3.6 mmol/L (ref 3.5–5.1)
SODIUM: 138 mmol/L (ref 135–145)
TOTAL PROTEIN: 7 g/dL (ref 6.5–8.1)

## 2016-07-03 LAB — LIPASE, BLOOD: Lipase: 15 U/L (ref 11–51)

## 2016-07-03 LAB — CBC
HCT: 38.8 % (ref 35.0–47.0)
Hemoglobin: 13.3 g/dL (ref 12.0–16.0)
MCH: 28.2 pg (ref 26.0–34.0)
MCHC: 34.2 g/dL (ref 32.0–36.0)
MCV: 82.5 fL (ref 80.0–100.0)
PLATELETS: 172 10*3/uL (ref 150–440)
RBC: 4.71 MIL/uL (ref 3.80–5.20)
RDW: 13.3 % (ref 11.5–14.5)
WBC: 3.2 10*3/uL — ABNORMAL LOW (ref 3.6–11.0)

## 2016-07-03 LAB — POCT PREGNANCY, URINE: Preg Test, Ur: NEGATIVE

## 2016-07-03 MED ORDER — ONDANSETRON 4 MG PO TBDP
4.0000 mg | ORAL_TABLET | Freq: Once | ORAL | Status: AC | PRN
  Administered 2016-07-03: 4 mg via ORAL
  Filled 2016-07-03: qty 1

## 2016-07-03 MED ORDER — ONDANSETRON 4 MG PO TBDP
4.0000 mg | ORAL_TABLET | Freq: Once | ORAL | Status: DC
  Filled 2016-07-03: qty 1

## 2016-07-03 MED ORDER — DOXYCYCLINE HYCLATE 100 MG PO CAPS: ORAL_CAPSULE | ORAL | 0 refills | Status: DC

## 2016-07-03 MED ORDER — ONDANSETRON 4 MG PO TBDP: ORAL_TABLET | ORAL | 0 refills | Status: DC

## 2016-07-03 MED ORDER — IBUPROFEN 600 MG PO TABS
600.0000 mg | ORAL_TABLET | Freq: Once | ORAL | Status: AC
  Administered 2016-07-03: 600 mg via ORAL
  Filled 2016-07-03: qty 1

## 2016-07-03 NOTE — ED Provider Notes (Signed)
[facility] Emergency Department Provider Note  ____________________________________________   First MD Initiated Contact with Patient 07/03/16 1459     (approximate)  I have reviewed the triage vital signs and the nursing notes.   HISTORY  Chief Complaint Emesis    HPI [name] is a 20 y.o. female is otherwise healthy who presents to the emergency department after an episode of vomiting in the setting of taking outpatient azithromycin 1000 mg for positive Chlamydia.  She came to the emergency department yesterday with no systemic symptoms including no abdominal pain but after finding out that she had been exposed to section transmitted diseases by unprotected sex with her boyfriend who was recently diagnosed.  She takes Depo-Provera since does not have regular menstrual periods.  She was discharged from the emergency department before her GC/Chlamydia tests resulted, but when the chlamydia came back positive (gonorrhea was negative), a dose of azithromycin 1 g by mouth was called into [facility].  She picked that up today and ate some food, which she was told to do, and it took her medicine.  Within about 10 minutes she had acute onset of nausea and vomited, and she saw some of her medication coming out as well.  She reports that she also had acute onset of lower abdominal pain after forcefully vomiting.  Her pain has been aching since that time, is worse on the right but is present on both sides of lower abdomen, is worse with movement, better with rest.  She had no abdominal pain prior to her forceful and sudden episode of emesis.She continues to report a small amount of vaginal discharge but no other significant pain.  She continues to deny fever/chills, chest pain, shortness of breath.   History reviewed. No pertinent past medical history.  There are no active problems to display for this patient.   Past Surgical History:  Procedure Laterality Date    . WISDOM TOOTH EXTRACTION      Prior to Admission medications   Medication Sig Start Date End Date Taking? Authorizing Provider  MedroxyPROGESTERone Acetate 150 MG/ML SUSY Inject 1 mL into the muscle every 3 (three) months. 03/26/16  Yes Historical Provider, MD  doxycycline (VIBRAMYCIN) 100 MG capsule Take 1 capsule (100 mg) by mouth twice daily for 14 days. 07/03/16   Loleta Rose, MD  ondansetron (ZOFRAN ODT) 4 MG disintegrating tablet Allow 1-2 tablets to dissolve in your mouth every 8 hours as needed for nausea/vomiting 07/03/16   Loleta Rose, MD    Allergies Tomato; Nickel; and Penicillins  No family history on file.  Social History Social History  Substance Use Topics  . Smoking status: Never Smoker  . Smokeless tobacco: Never Used  . Alcohol use No    Review of Systems Constitutional: No fever/chills Eyes: No visual changes. ENT: No sore throat. Cardiovascular: Denies chest pain. Respiratory: Denies shortness of breath. Gastrointestinal: +abdominal pain.  One episode of emesis and some persistent nausea after taking azithromycin 1 g by mouth as an outpatient.  No diarrhea.  No constipation. Genitourinary: Negative for dysuria.  Small amount of vaginal discharge Musculoskeletal: Negative for back pain. Skin: Negative for rash. Neurological: Negative for headaches, focal weakness or numbness.  10-point ROS otherwise negative.  ____________________________________________   PHYSICAL EXAM:  VITAL SIGNS: ED Triage Vitals  Enc Vitals Group     BP --      Pulse Rate 07/03/16 1058 99     Resp 07/03/16 1058 16  Temp 07/03/16 1058 98.3 F (36.8 C)     Temp Source 07/03/16 1058 Oral     SpO2 07/03/16 1058 99 %     Weight 07/03/16 1058 185 lb (83.9 kg)     Height 07/03/16 1058 5\' 6"  (1.676 m)     Head Circumference --      Peak Flow --      Pain Score 07/03/16 1059 8     Pain Loc --      Pain Edu? --      Excl. in GC? --     Constitutional: Alert and  oriented. Well appearing and in no acute distress. Eyes: Conjunctivae are normal. PERRL. EOMI. Head: Atraumatic. Nose: No congestion/rhinnorhea. Mouth/Throat: Mucous membranes are moist.  Oropharynx non-erythematous. Neck: No stridor.  No meningeal signs.   Cardiovascular: Normal rate, regular rhythm. Good peripheral circulation. Grossly normal heart sounds.   Respiratory: Normal respiratory effort.  No retractions. Lungs CTAB. Gastrointestinal: Soft and With diffuse mild tenderness to palpation throughout her abdomen, no focality, no rebound or guarding Genitourinary: Deferred since the patient had an exam yesterday Musculoskeletal: No lower extremity tenderness nor edema. No gross deformities of extremities. Neurologic:  Normal speech and language. No gross focal neurologic deficits are appreciated.  Skin:  Skin is warm, dry and intact. No rash noted. Psychiatric: Mood and affect are normal. Speech and behavior are normal.  ____________________________________________   LABS (all labs ordered are listed, but only abnormal results are displayed)  Labs Reviewed  COMPREHENSIVE METABOLIC PANEL - Abnormal; Notable for the following:       Result Value   Glucose, Bld 105 (*)    AST 53 (*)    ALT 96 (*)    All other components within normal limits  CBC - Abnormal; Notable for the following:    WBC 3.2 (*)    All other components within normal limits  URINALYSIS COMPLETEWITH MICROSCOPIC ([facility] ONLY) - Abnormal; Notable for the following:    Color, Urine AMBER (*)    APPearance CLEAR (*)    Ketones, ur 1+ (*)    Protein, ur 30 (*)    Squamous Epithelial / LPF 0-5 (*)    All other components within normal limits  LIPASE, BLOOD  POC URINE PREG, ED  POCT PREGNANCY, URINE   ____________________________________________  EKG  None ____________________________________________  RADIOLOGY   No results  found.  ____________________________________________   PROCEDURES  Procedure(s) performed:   Procedures   ____________________________________________   INITIAL IMPRESSION / ASSESSMENT AND PLAN / ED COURSE  Pertinent labs & imaging results that were available during my care of the patient were reviewed by me and considered in my medical decision making (see chart for details).  The patient is in no acute distress with normal vitals and normal lab work.  She is anxious because she has been in the emergency department for nearly 5 hours and states she is ready to go.  I consider diagnoses including but not limited to early appendicitis, PID/tubo-ovarian abscess, etc.  However, it seems most likely that she strained her abdominal muscles when forcefully and suddenly vomiting; it seems very unlikely to have acute onset of abdominal pain after her vomiting episode when she had not had any symptoms previously.  I discussed this with her as well as the possibility of either ultrasound or CT scan of her abdomen, but we both agreed that we think she will be feeling better soon and we will defer imaging at this time.  She  knows to return to the emergency department if she develops any new or worsening symptoms.  I will give another dose of Zofran for some mild persistent nausea, ibuprofen 600 mg for her abdominal/muscle pain, and I will give her a prescription for doxycycline or chlamydia treatments and she did not tolerate the azithromycin.  She assures me she will take the full course.  To be safe, given the very small possibility that she has mild PID and a negative gonorrhea tests, I will treat with a 2 week course of doxycycline instead of the usual 7 days.  I encouraged her to use over-the-counter pain medicine instead of narcotics.  I will also describes Zofran.  The patient agrees with this plan.   ____________________________________________  FINAL CLINICAL IMPRESSION(S) / ED  DIAGNOSES  Final diagnoses:  Chlamydia  Lower abdominal pain  Abdominal muscle strain, initial encounter     MEDICATIONS GIVEN DURING THIS VISIT:  Medications  ibuprofen (ADVIL,MOTRIN) tablet 600 mg (not administered)  ondansetron (ZOFRAN-ODT) disintegrating tablet 4 mg (not administered)  ondansetron (ZOFRAN-ODT) disintegrating tablet 4 mg (4 mg Oral Given 07/03/16 1113)     NEW OUTPATIENT MEDICATIONS STARTED DURING THIS VISIT:  New Prescriptions   DOXYCYCLINE (VIBRAMYCIN) 100 MG CAPSULE    Take 1 capsule (100 mg) by mouth twice daily for 14 days.   ONDANSETRON (ZOFRAN ODT) 4 MG DISINTEGRATING TABLET    Allow 1-2 tablets to dissolve in your mouth every 8 hours as needed for nausea/vomiting      Note:  This document was prepared using Dragon voice recognition software and may include unintentional dictation errors.    Loleta Rose, MD 07/03/16 [phone]

## 2016-07-03 NOTE — Discharge Instructions (Signed)
You have been seen today in the Emergency Department (ED) for pain and not being able to tolerate your azithromycin for chlamydia treatment.  Since the one time dose of azithromycin did not work, we have given you a longer course of treatment with doxycycline.  Please take the full course of treatment for 2 weeks (twice a day).  Do not stop the medication early.  We also provided a prescription for nausea medicine if she needed.  As we discussed, it seems most likely that your abdominal pain is a result of the worse while vomiting episode you had earlier today; it seems very unlikely that you developed a new issue such as early appendicitis, tubo-ovarian abscess, etc.  please take over-the-counter pain medicine as needed.  However, if he develop any new or worsening symptoms that concern you, including but not limited to fever, persistent vomiting, or worsening abdominal pain, or into the emergency department.  Remember that you should abstain from sexual intercourse until you have completed her treatment and that even after your treatment you can reacquire sexually transmitted diseases by having unprotected sex with infected partners.  Please follow up with your doctor as soon as possible regarding today?s ED visit and your symptoms.   Return to the ED if your pain worsens, you develop a fever, or for any other symptoms that concern you.

## 2016-07-03 NOTE — ED Triage Notes (Signed)
Pt says she took the azithromycin this am and vomited after.

## 2016-10-08 ENCOUNTER — Emergency Department
Admission: EM | Admit: 2016-10-08 | Discharge: 2016-10-08 | Disposition: A | Discharge status: Home / Self Care | Payer: Medicaid Other | Attending: Emergency Medicine | Admitting: Emergency Medicine

## 2016-10-08 DIAGNOSIS — Z79899 Other long term (current) drug therapy: Secondary | ICD-10-CM | POA: Diagnosis not present

## 2016-10-08 DIAGNOSIS — J029 Acute pharyngitis, unspecified: Secondary | ICD-10-CM | POA: Diagnosis present

## 2016-10-08 MED ORDER — DEXAMETHASONE SODIUM PHOSPHATE 10 MG/ML IJ SOLN
INTRAMUSCULAR | Status: AC
  Filled 2016-10-08: qty 1

## 2016-10-08 MED ORDER — DEXAMETHASONE SODIUM PHOSPHATE 10 MG/ML IJ SOLN
10.0000 mg | Freq: Once | INTRAMUSCULAR | Status: AC
  Administered 2016-10-08: 10 mg via INTRAMUSCULAR

## 2016-10-08 NOTE — ED Triage Notes (Signed)
Pt reports sore throat for past few days. States fever a few days ago 100.5. Hurts to swallow liquids.

## 2016-10-08 NOTE — Discharge Instructions (Signed)
Please seek medical attention for any high fevers, chest pain, shortness of breath, change in behavior, persistent vomiting, bloody stool or any other new or concerning symptoms.  

## 2016-10-08 NOTE — ED Provider Notes (Signed)
[facility] Note   ____________________________________________   I have reviewed the triage vital signs and the nursing notes.   HISTORY  Chief Complaint Sore Throat   History limited by: Not Limited   HPI [name] is a 20 y.o. female who comes in because of concern for possible strep throat. The patient states she started feeling sick roughly 3 days ago. Did have a fever to 100.5 at that time. Initially she noticed a mild amount of scratching to the back of her throat, however this has progressively gotten worse. The patient has now been complaining of pain. She states it is worse for hot fluids. She has tried over the counter medication without great relief.   No past medical history on file.  There are no active problems to display for this patient.   Past Surgical History:  Procedure Laterality Date  . WISDOM TOOTH EXTRACTION      Prior to Admission medications   Medication Sig Start Date End Date Taking? Authorizing Provider  doxycycline (VIBRAMYCIN) 100 MG capsule Take 1 capsule (100 mg) by mouth twice daily for 14 days. 07/03/16   Loleta Roseory Forbach, MD  MedroxyPROGESTERone Acetate 150 MG/ML SUSY Inject 1 mL into the muscle every 3 (three) months. 03/26/16   Historical Provider, MD  ondansetron (ZOFRAN ODT) 4 MG disintegrating tablet Allow 1-2 tablets to dissolve in your mouth every 8 hours as needed for nausea/vomiting 07/03/16   Loleta Roseory Forbach, MD    Allergies Tomato; Nickel; and Penicillins  No family history on file.  Social History Social History  Substance Use Topics  . Smoking status: Never Smoker  . Smokeless tobacco: Never Used  . Alcohol use No    Review of Systems  Constitutional: Positive for fever. Cardiovascular: Negative for chest pain. Respiratory: Negative for shortness of breath. Gastrointestinal: Negative for abdominal pain, vomiting and diarrhea. Neurological: Negative for  headaches, focal weakness or numbness.  10-point ROS otherwise negative.  ____________________________________________   PHYSICAL EXAM:  VITAL SIGNS: ED Triage Vitals  Enc Vitals Group     BP 10/08/16 1601 (!) 120/96     Pulse Rate 10/08/16 1601 72     Resp 10/08/16 1601 20     Temp 10/08/16 1601 98.8 F (37.1 C)     Temp Source 10/08/16 1601 Oral     SpO2 10/08/16 1601 99 %     Weight 10/08/16 1602 177 lb (80.3 kg)     Height 10/08/16 1602 5\' 6"  (1.676 m)     Head Circumference --      Peak Flow --      Pain Score 10/08/16 1603 10   Constitutional: Alert and oriented. Well appearing and in no distress. Eyes: Conjunctivae are normal. Normal extraocular movements. ENT   Head: Normocephalic and atraumatic.   Nose: No congestion/rhinnorhea.   Mouth/Throat: Mucous membranes are moist. Some erythema to bilateral tonsils. No uvula deviation.    Neck: No stridor. Hematological/Lymphatic/Immunilogical: No cervical lymphadenopathy. Cardiovascular: Normal rate, regular rhythm.  No murmurs, rubs, or gallops.  Respiratory: Normal respiratory effort without tachypnea nor retractions. Breath sounds are clear and equal bilaterally. No wheezes/rales/rhonchi. Gastrointestinal: Soft and nontender. No distention.  Genitourinary: Deferred Musculoskeletal: Normal range of motion in all extremities.  Neurologic:  Normal speech and language. No gross focal neurologic deficits are appreciated.  Skin:  Skin is warm, dry and intact. No rash noted. Psychiatric: Mood and affect are normal. Speech and behavior are normal. Patient exhibits appropriate insight and  judgment.  ____________________________________________    LABS (pertinent positives/negatives)  Labs Reviewed - No data to display   ____________________________________________   EKG  None  ____________________________________________     RADIOLOGY  None  ____________________________________________   PROCEDURES  Procedures  ____________________________________________   INITIAL IMPRESSION / ASSESSMENT AND PLAN / ED COURSE  Pertinent labs & imaging results that were available during my care of the patient were reviewed by me and considered in my medical decision making (see chart for details).  Patient with sore throat. Exam consistent with pharyngitis. No evidence of deep tissue infection. Patient given shot of steroids.   ____________________________________________   FINAL CLINICAL IMPRESSION(S) / ED DIAGNOSES  Final diagnoses:  Pharyngitis, unspecified etiology     Note: This dictation was prepared with Dragon dictation. Any transcriptional errors that result from this process are unintentional    Phineas SemenGraydon Elorah Dewing, MD 10/08/16 1824

## 2016-12-06 ENCOUNTER — Emergency Department
Admission: EM | Admit: 2016-12-06 | Discharge: 2016-12-06 | Disposition: A | Discharge status: Home / Self Care | Payer: Medicaid Other | Attending: Emergency Medicine | Admitting: Emergency Medicine

## 2016-12-06 ENCOUNTER — Encounter: Payer: Self-pay | Admitting: Emergency Medicine

## 2016-12-06 DIAGNOSIS — B9789 Other viral agents as the cause of diseases classified elsewhere: Secondary | ICD-10-CM

## 2016-12-06 DIAGNOSIS — J069 Acute upper respiratory infection, unspecified: Secondary | ICD-10-CM | POA: Insufficient documentation

## 2016-12-06 DIAGNOSIS — J029 Acute pharyngitis, unspecified: Secondary | ICD-10-CM | POA: Diagnosis present

## 2016-12-06 MED ORDER — PSEUDOEPH-BROMPHEN-DM 30-2-10 MG/5ML PO SYRP: 10.0000 mL | ORAL_SOLUTION | Freq: Four times a day (QID) | ORAL | 0 refills | Status: DC | PRN

## 2016-12-06 MED ORDER — PREDNISONE 5 MG PO TABS: 30.0000 mg | ORAL_TABLET | Freq: Every day | ORAL | 0 refills | Status: DC

## 2016-12-06 MED ORDER — FLUTICASONE PROPIONATE 50 MCG/ACT NA SUSP: 2.0000 | Freq: Every day | NASAL | 0 refills | Status: AC

## 2016-12-06 NOTE — ED Triage Notes (Signed)
Sore throat for couple of days 

## 2016-12-06 NOTE — ED Provider Notes (Signed)
[facility] Emergency Department Provider Note  ____________________________________________  Time seen: Approximately 1:32 PM  I have reviewed the triage vital signs and the nursing notes.   HISTORY  Chief Complaint Sore Throat    HPI [name] is a 21 y.o. female , NAD, presents to the emergency department with 2 day history of sore throat. States she has had nasal congestion, runny nose and mild cough over the last week. Over the last 2 days she has had increased postnasal drainage and sore throat. Denies any sinus pressure, ear pain. Has not noted any ear drainage. Denies any swelling about the lips/tongue/throat. No known sick contacts. Denies any fevers, chills or body aches.   History reviewed. No pertinent past medical history.  There are no active problems to display for this patient.   Past Surgical History:  Procedure Laterality Date  . WISDOM TOOTH EXTRACTION      Prior to Admission medications   Medication Sig Start Date End Date Taking? Authorizing Provider  brompheniramine-pseudoephedrine-DM 30-2-10 MG/5ML syrup Take 10 mLs by mouth 4 (four) times daily as needed. 12/06/16   Jami L Hagler, PA-C  fluticasone (FLONASE) 50 MCG/ACT nasal spray Place 2 sprays into both nostrils daily. 12/06/16   Jami L Hagler, PA-C  MedroxyPROGESTERone Acetate 150 MG/ML SUSY Inject 1 mL into the muscle every 3 (three) months. 03/26/16   Historical Provider, MD  predniSONE (DELTASONE) 5 MG tablet Take 6 tablets (30 mg total) by mouth daily with breakfast. May take for up to 5 days.  Do not take any NSAIDs with this medication. Take with food. 12/06/16   Jami L Hagler, PA-C    Allergies Tomato; Nickel; and Penicillins  No family history on file.  Social History Social History  Substance Use Topics  . Smoking status: Never Smoker  . Smokeless tobacco: Never Used  . Alcohol use No     Review of Systems  Constitutional: No fever/chills Eyes: No  discharge ENT: Positive sore throat, postnasal drainage, nasal congestion, runny nose. No ear pain, ear drainage, sinus pressure Cardiovascular: No chest pain. Respiratory: Positive dry cough. No shortness of breath. No wheezing.  Gastrointestinal: No abdominal pain.  No nausea, vomiting.   Musculoskeletal: Negative for general myalgias.  Skin: Negative for rash. Neurological: Negative for headaches. 10-point ROS otherwise negative.  ____________________________________________   PHYSICAL EXAM:  VITAL SIGNS: ED Triage Vitals [12/06/16 1326]  Enc Vitals Group     BP 118/69     Pulse Rate 93     Resp 18     Temp 98.1 F (36.7 C)     Temp Source Oral     SpO2 99 %     Weight 185 lb (83.9 kg)     Height 5\' 5"  (1.651 m)     Head Circumference      Peak Flow      Pain Score      Pain Loc      Pain Edu?      Excl. in GC?      Constitutional: Alert and oriented. Well appearing and in no acute distress. Eyes: Conjunctivae are normal.  Head: Atraumatic. ENT:      Ears: TMs visualized bilaterally with mild bulging but no serous effusion, erythema or perforation.      Nose: Mild congestion with clear rhinorrhea.      Mouth/Throat: Mucous membranes are moist. Pharynx without erythema, swelling, exudate. Uvula is midline. Airway is patent. Clear postnasal drip. Neck: No stridor. Supple  with full range of motion. Hematological/Lymphatic/Immunilogical: No cervical lymphadenopathy. Cardiovascular: Normal rate, regular rhythm. Normal S1 and S2.  Good peripheral circulation. Respiratory: Normal respiratory effort without tachypnea or retractions. Lungs CTAB with breath sounds noted in all lung fields. No wheeze, rhonchi, rales Neurologic:  Normal speech and language. No gross focal neurologic deficits are appreciated.  Skin:  Skin is warm, dry and intact. No rash noted. Psychiatric: Mood and affect are normal. Speech and behavior are normal. Patient exhibits appropriate insight and  judgement.   ____________________________________________   LABS (all labs ordered are listed, but only abnormal results are displayed)  Labs Reviewed - No data to display   Rapid strep was negative. ____________________________________________  EKG  None ____________________________________________  RADIOLOGY  None ____________________________________________    PROCEDURES  Procedure(s) performed: None   Procedures   Medications - No data to display   ____________________________________________   INITIAL IMPRESSION / ASSESSMENT AND PLAN / ED COURSE  Pertinent labs & imaging results that were available during my care of the patient were reviewed by me and considered in my medical decision making (see chart for details).  Clinical Course     Patient's diagnosis is consistent with Viral pharyngitis and URI with cough. Patient will be discharged home with prescriptions for Bromfed-DM, Flonase and prednisone to take as directed. Patient is to follow up with [facility] if symptoms persist past this treatment course. Patient is given ED precautions to return to the ED for any worsening or new symptoms.    ____________________________________________  FINAL CLINICAL IMPRESSION(S) / ED DIAGNOSES  Final diagnoses:  Viral pharyngitis  Viral URI with cough      NEW MEDICATIONS STARTED DURING THIS VISIT:  Discharge Medication List as of 12/06/2016  2:33 PM    START taking these medications   Details  brompheniramine-pseudoephedrine-DM 30-2-10 MG/5ML syrup Take 10 mLs by mouth 4 (four) times daily as needed., Starting Wed 12/06/2016, Print    fluticasone (FLONASE) 50 MCG/ACT nasal spray Place 2 sprays into both nostrils daily., Starting Wed 12/06/2016, Print    predniSONE (DELTASONE) 5 MG tablet Take 6 tablets (30 mg total) by mouth daily with breakfast. May take for up to 5 days.  Do not take any NSAIDs with this medication. Take with food., Starting  Wed 12/06/2016, Print             Hope Pigeon, PA-C 12/06/16 1840    Sharman Cheek, MD 12/10/16 [phone]

## 2016-12-17 ENCOUNTER — Emergency Department
Admission: EM | Admit: 2016-12-17 | Discharge: 2016-12-17 | Disposition: A | Discharge status: Home / Self Care | Payer: Medicaid Other | Attending: Emergency Medicine | Admitting: Emergency Medicine

## 2016-12-17 ENCOUNTER — Emergency Department: Payer: Medicaid Other

## 2016-12-17 ENCOUNTER — Encounter: Payer: Self-pay | Admitting: Emergency Medicine

## 2016-12-17 DIAGNOSIS — R1030 Lower abdominal pain, unspecified: Secondary | ICD-10-CM | POA: Insufficient documentation

## 2016-12-17 DIAGNOSIS — R102 Pelvic and perineal pain: Secondary | ICD-10-CM

## 2016-12-17 DIAGNOSIS — R112 Nausea with vomiting, unspecified: Secondary | ICD-10-CM | POA: Diagnosis not present

## 2016-12-17 DIAGNOSIS — R110 Nausea: Secondary | ICD-10-CM

## 2016-12-17 DIAGNOSIS — R197 Diarrhea, unspecified: Secondary | ICD-10-CM | POA: Diagnosis not present

## 2016-12-17 LAB — COMPREHENSIVE METABOLIC PANEL
ALT: 14 U/L (ref 14–54)
AST: 20 U/L (ref 15–41)
Albumin: 4.2 g/dL (ref 3.5–5.0)
Alkaline Phosphatase: 59 U/L (ref 38–126)
Anion gap: 5 (ref 5–15)
BILIRUBIN TOTAL: 0.9 mg/dL (ref 0.3–1.2)
BUN: 11 mg/dL (ref 6–20)
CO2: 26 mmol/L (ref 22–32)
CREATININE: 0.68 mg/dL (ref 0.44–1.00)
Calcium: 8.9 mg/dL (ref 8.9–10.3)
Chloride: 105 mmol/L (ref 101–111)
Glucose, Bld: 102 mg/dL — ABNORMAL HIGH (ref 65–99)
Potassium: 4 mmol/L (ref 3.5–5.1)
Sodium: 136 mmol/L (ref 135–145)
TOTAL PROTEIN: 8.1 g/dL (ref 6.5–8.1)

## 2016-12-17 LAB — URINALYSIS, COMPLETE (UACMP) WITH MICROSCOPIC
BILIRUBIN URINE: NEGATIVE
Bacteria, UA: NONE SEEN
GLUCOSE, UA: NEGATIVE mg/dL
HGB URINE DIPSTICK: NEGATIVE
Ketones, ur: 5 mg/dL — AB
LEUKOCYTES UA: NEGATIVE
NITRITE: NEGATIVE
PH: 8 (ref 5.0–8.0)
Protein, ur: NEGATIVE mg/dL
SPECIFIC GRAVITY, URINE: 1.026 (ref 1.005–1.030)

## 2016-12-17 LAB — WET PREP, GENITAL
Clue Cells Wet Prep HPF POC: NONE SEEN
SPERM: NONE SEEN
TRICH WET PREP: NONE SEEN
WBC, Wet Prep HPF POC: NONE SEEN
Yeast Wet Prep HPF POC: NONE SEEN

## 2016-12-17 LAB — CBC
HCT: 38.1 % (ref 35.0–47.0)
Hemoglobin: 13.1 g/dL (ref 12.0–16.0)
MCH: 28.4 pg (ref 26.0–34.0)
MCHC: 34.4 g/dL (ref 32.0–36.0)
MCV: 82.6 fL (ref 80.0–100.0)
PLATELETS: 239 10*3/uL (ref 150–440)
RBC: 4.61 MIL/uL (ref 3.80–5.20)
RDW: 12.6 % (ref 11.5–14.5)
WBC: 6.1 10*3/uL (ref 3.6–11.0)

## 2016-12-17 LAB — PREGNANCY, URINE: PREG TEST UR: NEGATIVE

## 2016-12-17 LAB — CHLAMYDIA/NGC RT PCR (ARMC ONLY)
Chlamydia Tr: NOT DETECTED
N gonorrhoeae: NOT DETECTED

## 2016-12-17 LAB — LIPASE, BLOOD: Lipase: 21 U/L (ref 11–51)

## 2016-12-17 MED ORDER — SODIUM CHLORIDE 0.9 % IV BOLUS (SEPSIS)
1000.0000 mL | Freq: Once | INTRAVENOUS | Status: AC
  Administered 2016-12-17: 1000 mL via INTRAVENOUS

## 2016-12-17 MED ORDER — MORPHINE SULFATE (PF) 4 MG/ML IV SOLN
4.0000 mg | Freq: Once | INTRAVENOUS | Status: AC
  Administered 2016-12-17: 4 mg via INTRAVENOUS
  Filled 2016-12-17: qty 1

## 2016-12-17 MED ORDER — ONDANSETRON HCL 4 MG/2ML IJ SOLN
4.0000 mg | Freq: Once | INTRAMUSCULAR | Status: AC
  Administered 2016-12-17: 4 mg via INTRAVENOUS
  Filled 2016-12-17: qty 2

## 2016-12-17 MED ORDER — IOPAMIDOL (ISOVUE-300) INJECTION 61%: 30.0000 mL | Freq: Once | INTRAVENOUS | Status: DC

## 2016-12-17 MED ORDER — FENTANYL CITRATE (PF) 100 MCG/2ML IJ SOLN
50.0000 ug | INTRAMUSCULAR | Status: DC | PRN
  Administered 2016-12-17: 50 ug via NASAL
  Filled 2016-12-17: qty 2

## 2016-12-17 MED ORDER — ONDANSETRON HCL 4 MG PO TABS: 4.0000 mg | ORAL_TABLET | Freq: Three times a day (TID) | ORAL | 0 refills | Status: AC | PRN

## 2016-12-17 MED ORDER — BARIUM SULFATE 2.1 % PO SUSP
450.0000 mL | ORAL | Status: AC
  Administered 2016-12-17 (×2): 450 mL via ORAL

## 2016-12-17 NOTE — Discharge Instructions (Signed)
Return to the emergency department immediately for any fever, worsening abdominal pain, black or bloody stool, dizziness or passing out, or any other symptoms concerning to you.

## 2016-12-17 NOTE — ED Notes (Signed)
IV attempted x2 by this RN. RN Madolyn FriezeLea F. In to see pt and place IV.

## 2016-12-17 NOTE — ED Notes (Signed)
Pt offered a Zofran in triage for nausea which she has declined; says last time she took Zofran she vomited a few minutes later; pt says she is scared to take anything at this time

## 2016-12-17 NOTE — ED Provider Notes (Signed)
[facility]  I accepted care from Dr. Zenda AlpersWebster ____________________________________________    LABS (pertinent positives/negatives)  Labs Reviewed  COMPREHENSIVE METABOLIC PANEL - Abnormal; Notable for the following:       Result Value   Glucose, Bld 102 (*)    All other components within normal limits  URINALYSIS, COMPLETE (UACMP) WITH MICROSCOPIC - Abnormal; Notable for the following:    Color, Urine YELLOW (*)    APPearance CLEAR (*)    Ketones, ur 5 (*)    Squamous Epithelial / LPF 0-5 (*)    All other components within normal limits  WET PREP, GENITAL  CHLAMYDIA/NGC RT PCR ([facility] ONLY)  LIPASE, BLOOD  CBC  PREGNANCY, URINE     ____________________________________________    RADIOLOGY All xrays were viewed by me. Imaging interpreted by radiologist.  CT abdomen and pelvis with contrast:  No acute process in the abdomen or pelvis  ____________________________________________   PROCEDURES  Procedure(s) performed: None  Critical Care performed: None  ____________________________________________   INITIAL IMPRESSION / ASSESSMENT AND PLAN / ED COURSE   Pertinent labs & imaging results that were available during my care of the patient were reviewed by me and considered in my medical decision making (see chart for details).  I accepted patient care at shift change, patient with complaint of suprapubic pain, and reassuring laboratory studies, CT scan of the abdomen and pelvis is pending.  I reviewed the CT abdomen and pelvis results, no emergency findings.  I went in to speak with the patient, she did have an episode of diarrhea. She also states that she's had nausea. It's possible that her discomfort preceded the GI symptoms and that may be this is viral GI illness. Also possible that the contrast has given her the GI symptoms. In any case, I push on her abdomen again and she has no lateralizing abdominal discomfort when I press in  her pelvis, it suprapubic. I discussed with her the risk versus benefit of proceeding forward with imaging of ovaries with ultrasound. My suspicion for ovarian cyst or ovarian torsion is extremely low based on the location of the pain and the description of the pain, in addition to no findings on CT raising suspicion into the areas of ovaries.  She states that she would like to go home with a pressure from her nausea. We discussed trying Imodium. We discussed return precautions.  CONSULTATIONS: None    Patient / Family / Caregiver informed of clinical course, medical decision-making process, and agree with plan.   I discussed return precautions, follow-up instructions, and discharged instructions with patient and/or family.     ____________________________________________   FINAL CLINICAL IMPRESSION(S) / ED DIAGNOSES  Final diagnoses:  Lower abdominal pain  Nausea  Diarrhea in adult patient  Suprapubic pain        Governor Rooksebecca Kevante Lunt, MD 12/17/16 1021

## 2016-12-17 NOTE — ED Provider Notes (Signed)
[facility] Emergency Department Provider Note   ____________________________________________   First MD Initiated Contact with Patient 12/17/16 [phone]     (approximate)  I have reviewed the triage vital signs and the nursing notes.   HISTORY  Chief Complaint Emesis and Abdominal Pain    HPI [name] is a 21 y.o. female who comes into the hospital today with lower abdominal pain, vomiting, diarrhea and fever. She reports that she's been having waves of pain. She states that this pain started today. It is stabbing in the middle of her lower abdomen. The patient denies any pain with urination or vaginal discharge. She reports that she has feels weak. Every time she vomited she's felt worse and worse and she thought she might pass out at one point. The patient states she's had 4 episodes of emesis and that look like what she ate. She's had decreased appetite and reports she had a temperature of 103.9. She did not take any medicine at home but she reports that the temperature resolved. She tried to take Zofran at home but vomited it back up. The patient rates her pain a 7 out of 10 in intensity currently. She is here for evaluation.   History reviewed. No pertinent past medical history.  There are no active problems to display for this patient.   Past Surgical History:  Procedure Laterality Date  . WISDOM TOOTH EXTRACTION      Prior to Admission medications   Medication Sig Start Date End Date Taking? Authorizing Provider  fluticasone (FLONASE) 50 MCG/ACT nasal spray Place 2 sprays into both nostrils daily. 12/06/16  Yes Jami L Hagler, PA-C  MedroxyPROGESTERone Acetate 150 MG/ML SUSY Inject 1 mL into the muscle every 3 (three) months. 03/26/16  Yes Historical Provider, MD    Allergies Tomato; Nickel; and Penicillins  History reviewed. No pertinent family history.  Social History Social History  Substance Use Topics  . Smoking status: Never  Smoker  . Smokeless tobacco: Never Used  . Alcohol use No    Review of Systems Constitutional:  fever/chills Eyes: No visual changes. ENT: No sore throat. Cardiovascular: Denies chest pain. Respiratory: Denies shortness of breath. Gastrointestinal:  abdominal pain.  nausea, vomiting. diarrhea.  No constipation. Genitourinary: Negative for dysuria. Musculoskeletal: Negative for back pain. Skin: Negative for rash. Neurological: Negative for headaches, focal weakness or numbness.  10-point ROS otherwise negative.  ____________________________________________   PHYSICAL EXAM:  VITAL SIGNS: ED Triage Vitals  Enc Vitals Group     BP 12/17/16 0048 127/73     Pulse Rate 12/17/16 0048 88     Resp 12/17/16 0048 18     Temp 12/17/16 0048 98.5 F (36.9 C)     Temp Source 12/17/16 0048 Oral     SpO2 12/17/16 0048 98 %     Weight 12/17/16 0048 185 lb (83.9 kg)     Height 12/17/16 0048 5\' 6"  (1.676 m)     Head Circumference --      Peak Flow --      Pain Score 12/17/16 0056 9     Pain Loc --      Pain Edu? --      Excl. in GC? --     Constitutional: Alert and oriented. Well appearing and in Moderate distress. Eyes: Conjunctivae are normal. PERRL. EOMI. Head: Atraumatic. Nose: No congestion/rhinnorhea. Mouth/Throat: Mucous membranes are moist.  Oropharynx non-erythematous. Cardiovascular: Normal rate, regular rhythm. Grossly normal heart sounds.  Good peripheral circulation. Respiratory:  Normal respiratory effort.  No retractions. Lungs CTAB. Gastrointestinal: Soft with some lower abdominal tenderness to palpation worse in the suprapubic area. No distention. Positive bowel sounds Genitourinary: Normal external genitalia, discomfort with speculum exam but minimal discharge. No significant cervical motion tenderness to palpation, bilateral adnexal and uterine tenderness to palpation. Musculoskeletal: No lower extremity tenderness nor edema.   Neurologic:  Normal speech and  language.  Skin:  Skin is warm, dry and intact.  Psychiatric: Mood and affect are normal.   ____________________________________________   LABS (all labs ordered are listed, but only abnormal results are displayed)  Labs Reviewed  COMPREHENSIVE METABOLIC PANEL - Abnormal; Notable for the following:       Result Value   Glucose, Bld 102 (*)    All other components within normal limits  URINALYSIS, COMPLETE (UACMP) WITH MICROSCOPIC - Abnormal; Notable for the following:    Color, Urine YELLOW (*)    APPearance CLEAR (*)    Ketones, ur 5 (*)    Squamous Epithelial / LPF 0-5 (*)    All other components within normal limits  WET PREP, GENITAL  CHLAMYDIA/NGC RT PCR ([facility] ONLY)  LIPASE, BLOOD  CBC  PREGNANCY, URINE   ____________________________________________  EKG  none ____________________________________________  RADIOLOGY  CT abd and pelvis ____________________________________________   PROCEDURES  Procedure(s) performed: None  Procedures  Critical Care performed: No  ____________________________________________   INITIAL IMPRESSION / ASSESSMENT AND PLAN / ED COURSE  Pertinent labs & imaging results that were available during my care of the patient were reviewed by me and considered in my medical decision making (see chart for details).  This is a 21 year old female who comes into the hospital today with some lower abdominal pain, fevers, vomiting and diarrhea. The patient denies any discharge and she reports pain across her entire lower abdomen. I will send the patient for a CT scan although her blood work is unremarkable. I will give the patient a dose of morphine, Zofran and a liter of normal saline. As she complained that she was allergic Iodine I will have her drink barium and do a noncontrasted study.  Clinical Course    The patient at this time is waiting for her CT. Her care was signed out to Dr. Shaune PollackLord who will follow-up the results of the CT  and determine if the patient needs an ultrasound. She'll also reassess the patient. I will give her a second dose of for her pain.  ____________________________________________   FINAL CLINICAL IMPRESSION(S) / ED DIAGNOSES  Final diagnoses:  Lower abdominal pain      NEW MEDICATIONS STARTED DURING THIS VISIT:  New Prescriptions   No medications on file     Note:  This document was prepared using Dragon voice recognition software and may include unintentional dictation errors.    Rebecka ApleyAllison P Webster, MD 12/17/16 [phone]

## 2016-12-17 NOTE — ED Notes (Signed)
AAOx3.  Skin warm and dry.  Resting.  C/O 7/10 lower abdominal pain.  States Morphine decreased pain.  Patient is anxious, emotional support given with minimal effect.  Continue to monitor.

## 2016-12-17 NOTE — ED Triage Notes (Addendum)
Pt reports vomiting 4 times since 2pm on Saturday, last time about 1 hour ago; also reports fever of 103.9 at 9pm and diarrhea; pt did not take any medication for fever; pt says "my job" made me sick; says she didn't get any breaks yesterday; pt very talkative in triage; denies urinary s/s;

## 2016-12-17 NOTE — ED Notes (Signed)
AAOx3.  Skin warm and dry. NAD.  Ambulates with easy and steady gait.   

## 2016-12-17 NOTE — ED Notes (Signed)
[facility] [employer] [facility] [facility] AAOx3.  Skin warm and dry.  NAD

## 2016-12-19 ENCOUNTER — Encounter: Payer: Self-pay | Admitting: Emergency Medicine

## 2016-12-19 ENCOUNTER — Emergency Department
Admission: EM | Admit: 2016-12-19 | Discharge: 2016-12-19 | Disposition: A | Discharge status: Home / Self Care | Payer: Medicaid Other | Attending: Emergency Medicine | Admitting: Emergency Medicine

## 2016-12-19 DIAGNOSIS — R1084 Generalized abdominal pain: Secondary | ICD-10-CM | POA: Insufficient documentation

## 2016-12-19 DIAGNOSIS — R197 Diarrhea, unspecified: Secondary | ICD-10-CM

## 2016-12-19 DIAGNOSIS — R1032 Left lower quadrant pain: Secondary | ICD-10-CM | POA: Diagnosis present

## 2016-12-19 LAB — COMPREHENSIVE METABOLIC PANEL
ALBUMIN: 4.3 g/dL (ref 3.5–5.0)
ALK PHOS: 55 U/L (ref 38–126)
ALT: 18 U/L (ref 14–54)
AST: 22 U/L (ref 15–41)
Anion gap: 7 (ref 5–15)
BUN: 9 mg/dL (ref 6–20)
CALCIUM: 9.2 mg/dL (ref 8.9–10.3)
CHLORIDE: 106 mmol/L (ref 101–111)
CO2: 22 mmol/L (ref 22–32)
CREATININE: 0.63 mg/dL (ref 0.44–1.00)
GFR calc Af Amer: >60 mL/min (ref >60)
GFR calc non Af Amer: >60 mL/min (ref >60)
GLUCOSE: 74 mg/dL (ref 65–99)
Potassium: 3.8 mmol/L (ref 3.5–5.1)
SODIUM: 135 mmol/L (ref 135–145)
Total Bilirubin: 0.4 mg/dL (ref 0.3–1.2)
Total Protein: 8.1 g/dL (ref 6.5–8.1)

## 2016-12-19 LAB — CBC
HCT: 41.2 % (ref 35.0–47.0)
Hemoglobin: 13.7 g/dL (ref 12.0–16.0)
MCH: 27.8 pg (ref 26.0–34.0)
MCHC: 33.2 g/dL (ref 32.0–36.0)
MCV: 83.9 fL (ref 80.0–100.0)
Platelets: 231 10*3/uL (ref 150–440)
RBC: 4.91 MIL/uL (ref 3.80–5.20)
RDW: 12.4 % (ref 11.5–14.5)
WBC: 4.3 10*3/uL (ref 3.6–11.0)

## 2016-12-19 LAB — LIPASE, BLOOD: LIPASE: 23 U/L (ref 11–51)

## 2016-12-19 MED ORDER — DICYCLOMINE HCL 20 MG PO TABS: 20.0000 mg | ORAL_TABLET | Freq: Three times a day (TID) | ORAL | 0 refills | Status: AC | PRN

## 2016-12-19 NOTE — ED Triage Notes (Addendum)
Pt to ED via POV, was seen here Sunday night for nuasea/vomiting/diarrhea, was given zofran rx. Pt states abd pain is not resolved, decreased PO intake. Pt denies fevers. Pt A&Ox4, NAD noted.

## 2016-12-19 NOTE — Discharge Instructions (Signed)
Please seek medical attention for any high fevers, chest pain, shortness of breath, change in behavior, persistent vomiting, bloody stool or any other new or concerning symptoms.  

## 2016-12-19 NOTE — ED Provider Notes (Signed)
[facility] Emergency Department Provider Note    ____________________________________________   I have reviewed the triage vital signs and the nursing notes.   HISTORY  Chief Complaint Abdominal Pain   History limited by: Not Limited   HPI [name] is a 21 y.o. female who presents to the emergency department today because of continued abdominal pain and diarrhea. She states that the pain is in her lower abdomen primarily. It is now in her left lower quadrant. It does come and go in waves. It is sometimes severe. She states she has had significant diarrhea with this. She states that she will eat something and it will shortly exit her body. She has not had any fevers. Was seen in the emergency department 2 days ago for this. Had a CT scan at that time which was negative.   History reviewed. No pertinent past medical history.  There are no active problems to display for this patient.   Past Surgical History:  Procedure Laterality Date  . WISDOM TOOTH EXTRACTION      Prior to Admission medications   Medication Sig Start Date End Date Taking? Authorizing Provider  fluticasone (FLONASE) 50 MCG/ACT nasal spray Place 2 sprays into both nostrils daily. 12/06/16   Jami L Hagler, PA-C  ondansetron (ZOFRAN) 4 MG tablet Take 1 tablet (4 mg total) by mouth every 8 (eight) hours as needed for nausea or vomiting. 12/17/16   Governor Rooksebecca Lord, MD    Allergies Tomato; Nickel; and Penicillins  No family history on file.  Social History Social History  Substance Use Topics  . Smoking status: Never Smoker  . Smokeless tobacco: Never Used  . Alcohol use No    Review of Systems  Constitutional: Negative for fever. Cardiovascular: Negative for chest pain. Respiratory: Negative for shortness of breath. Gastrointestinal: Positive for abdominal pain and diarrhea. Genitourinary: Negative for dysuria. Musculoskeletal: Negative for back pain. Skin: Negative for  rash. Neurological: Negative for headaches, focal weakness or numbness.  10-point ROS otherwise negative.  ____________________________________________   PHYSICAL EXAM:  VITAL SIGNS: ED Triage Vitals  Enc Vitals Group     BP 12/19/16 1224 125/79     Pulse Rate 12/19/16 1224 73     Resp 12/19/16 1224 16     Temp 12/19/16 1224 98.2 F (36.8 C)     Temp Source 12/19/16 1224 Oral     SpO2 12/19/16 1224 100 %     Weight 12/19/16 1224 198 lb (89.8 kg)     Height 12/19/16 1224 5\' 6"  (1.676 m)     Head Circumference --      Peak Flow --      Pain Score 12/19/16 1225 10     Pain Loc --      Pain Edu? --      Excl. in GC? --      Constitutional: Alert and oriented. Well appearing and in no distress. Eyes: Conjunctivae are normal. Normal extraocular movements. ENT   Head: Normocephalic and atraumatic.   Nose: No congestion/rhinnorhea.   Mouth/Throat: Mucous membranes are moist.   Neck: No stridor. Hematological/Lymphatic/Immunilogical: No cervical lymphadenopathy. Cardiovascular: Normal rate, regular rhythm.  No murmurs, rubs, or gallops.  Respiratory: Normal respiratory effort without tachypnea nor retractions. Breath sounds are clear and equal bilaterally. No wheezes/rales/rhonchi. Gastrointestinal: Soft and slightly tender to palpation diffusely, worse in the lower abdomen. No rebound. No guarding.  Genitourinary: Deferred Musculoskeletal: Normal range of motion in all extremities. No lower extremity edema. Neurologic:  Normal speech and language. No gross focal neurologic deficits are appreciated.  Skin:  Skin is warm, dry and intact. No rash noted. Psychiatric: Mood and affect are normal. Speech and behavior are normal. Patient exhibits appropriate insight and judgment.  ____________________________________________    LABS (pertinent positives/negatives)  Labs Reviewed  LIPASE, BLOOD  COMPREHENSIVE METABOLIC PANEL  CBC  URINALYSIS, COMPLETE (UACMP) WITH  MICROSCOPIC     ____________________________________________   EKG  None  ____________________________________________    RADIOLOGY  None  ____________________________________________   PROCEDURES  Procedures  ____________________________________________   INITIAL IMPRESSION / ASSESSMENT AND PLAN / ED COURSE  Pertinent labs & imaging results that were available during my care of the patient were reviewed by me and considered in my medical decision making (see chart for details).  Patient presents to the emergency department today with continued abdominal pain and diarrhea. Patient was seen in the emergency department 2 days ago had a negative workup at that time including pelvic exam and CT scan. At this point patient has some tenderness diffusely however abdomen is soft. No rebound or guarding. Patient was slightly tender in the lower abdomen however was bilateral. At this point I did discuss possibility of ovarian pathology however patient declined to wait for ultrasound. I think this is reasonable given that it is accompanied by significant diarrhea chart would not expect from ovarian pathology. Additionally patient had a negative CT scan done 2 days ago without any abnormal or concerning findings. Given the patient's mother complaining of pain will add Bentyl to patient's medication regimen. Discussed with patient importance of following up with primary care.  ____________________________________________   FINAL CLINICAL IMPRESSION(S) / ED DIAGNOSES  Final diagnoses:  Generalized abdominal pain  Diarrhea, unspecified type     Note: This dictation was prepared with Dragon dictation. Any transcriptional errors that result from this process are unintentional     Phineas Semen, MD 12/19/16 1445

## 2018-01-21 IMAGING — CT CT ABD-PELV W/O CM
2 of 4 series · 17 of 46 positions shown, 19 images · non-contrast
Comparison: None.

CLINICAL DATA: Lower abdominal pain.  Nausea vomiting and diarrhea.

EXAM:
CT ABDOMEN AND PELVIS WITHOUT CONTRAST
TECHNIQUE: Multidetector CT imaging of the abdomen and pelvis was performed
following the standard protocol without IV contrast.

[Series 2: routine abd/pel wo · axial · 0.84mm/px · z∈[-944,-544]mm · 14 of 88 slices shown, 16 images]
[im 4/88  soft-tissue]
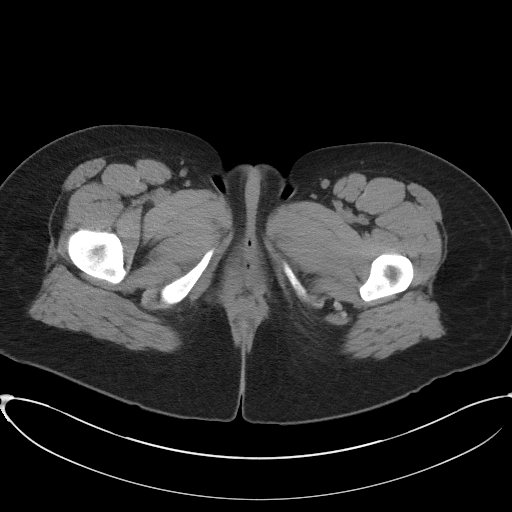
[im 4/88  bone]
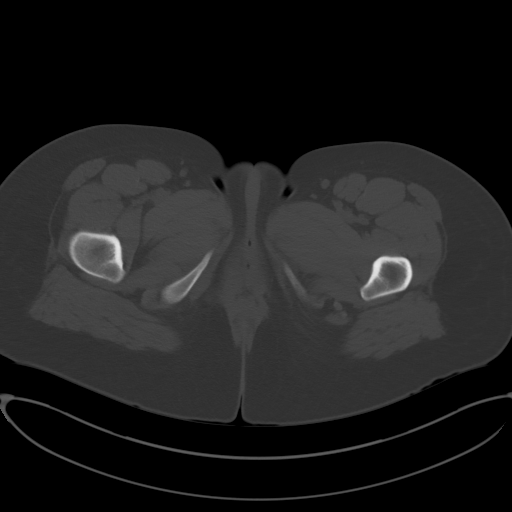
[im 11/88  soft-tissue]
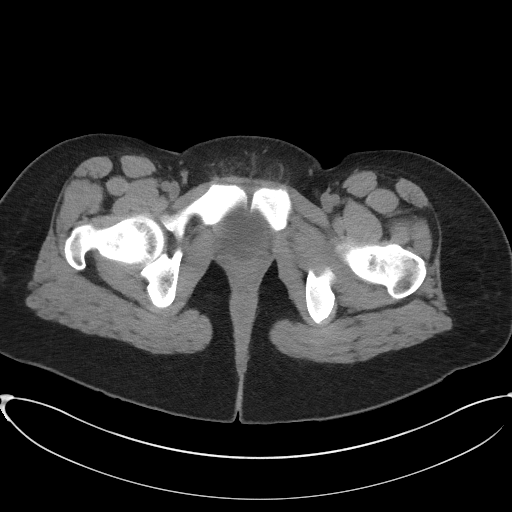
[im 17/88  soft-tissue]
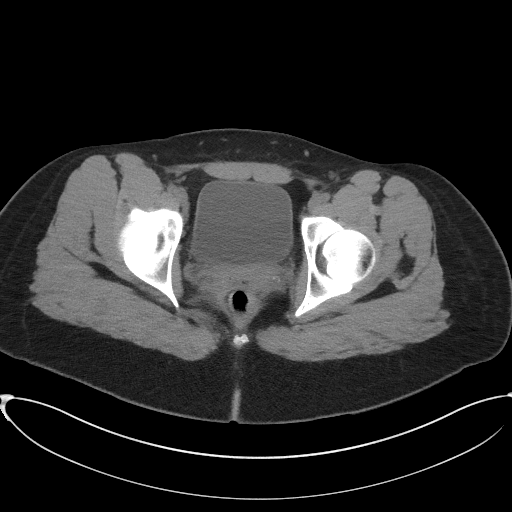
[im 24/88  soft-tissue]
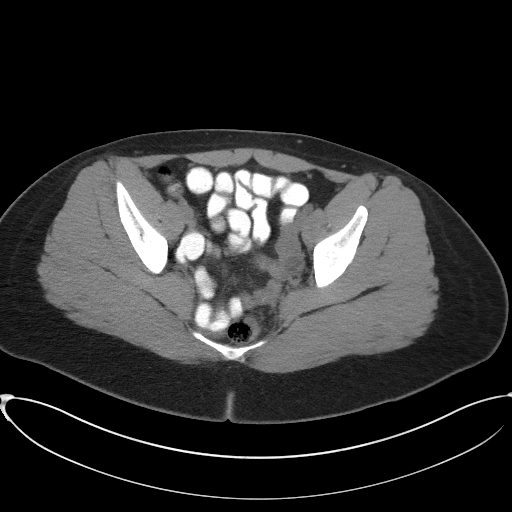
[im 31/88  soft-tissue]
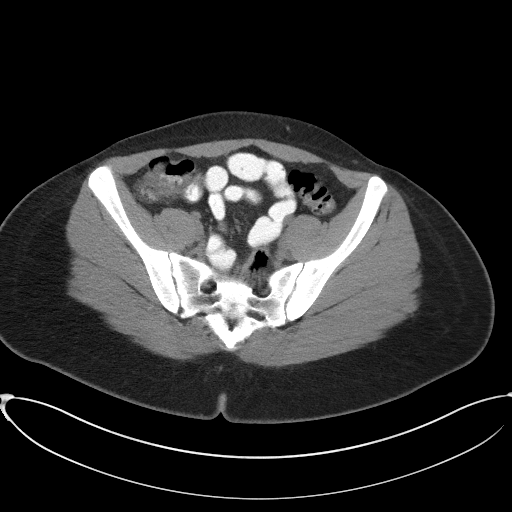
[im 34/88  soft-tissue]
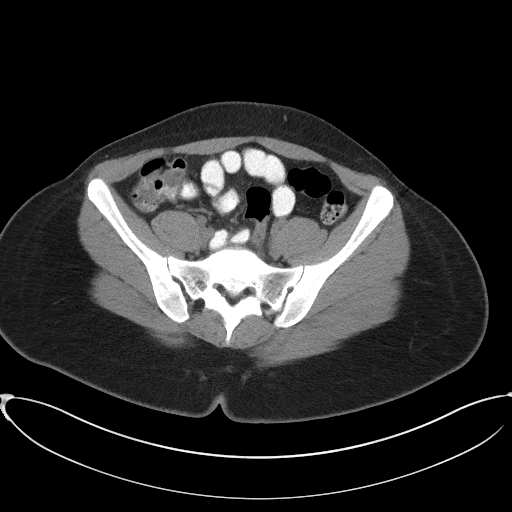
[im 41/88  soft-tissue]
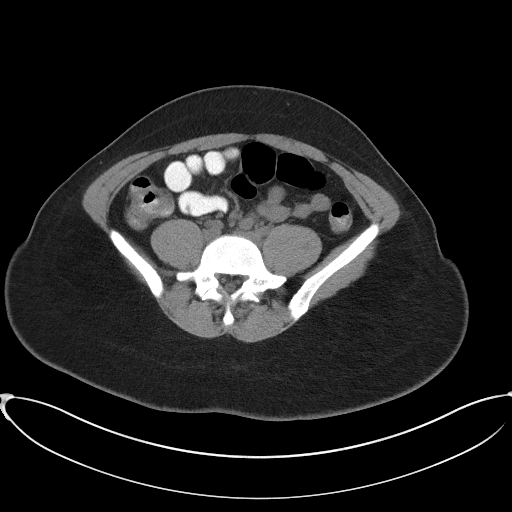
[im 47/88  soft-tissue]
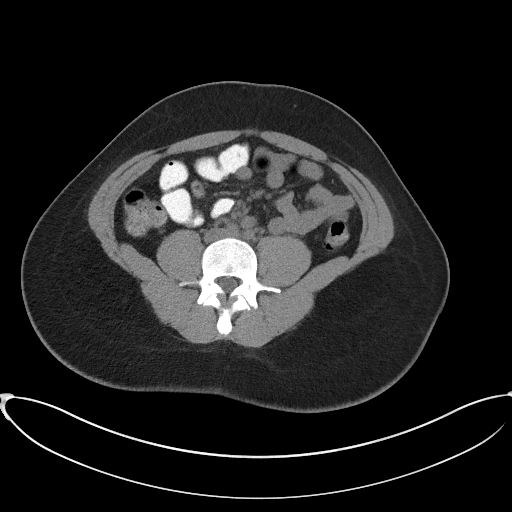
[im 54/88  soft-tissue]
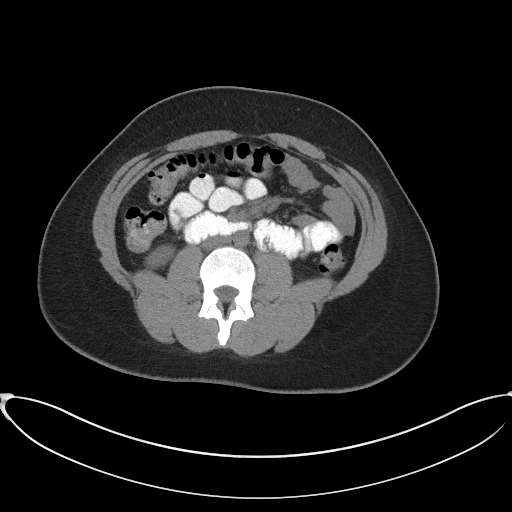
[im 54/88  bone]
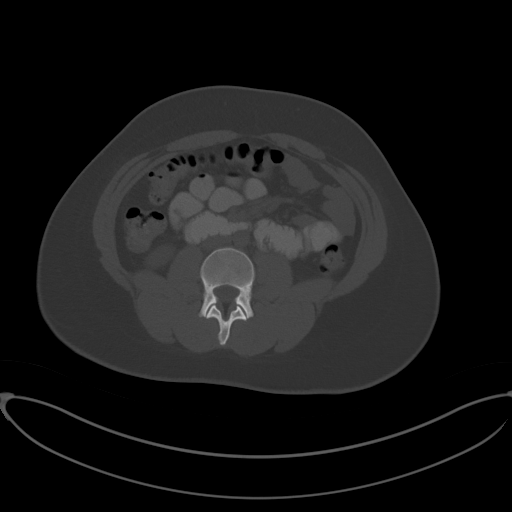
[im 57/88  soft-tissue]
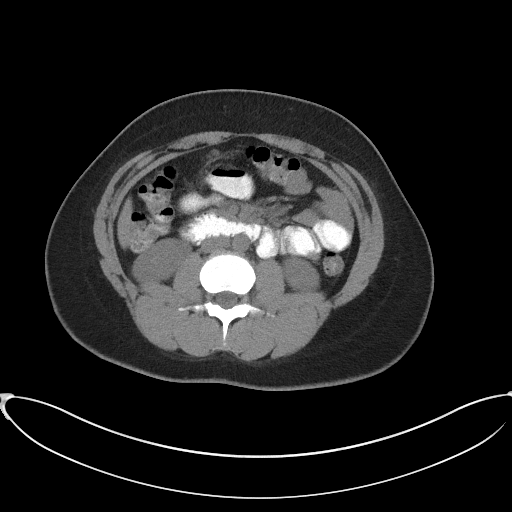
[im 64/88  soft-tissue]
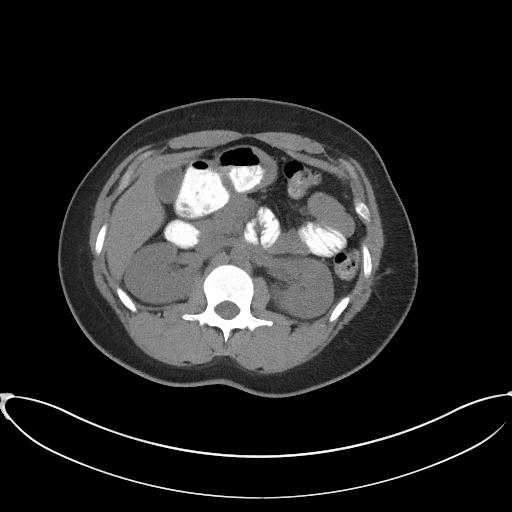
[im 71/88  soft-tissue]
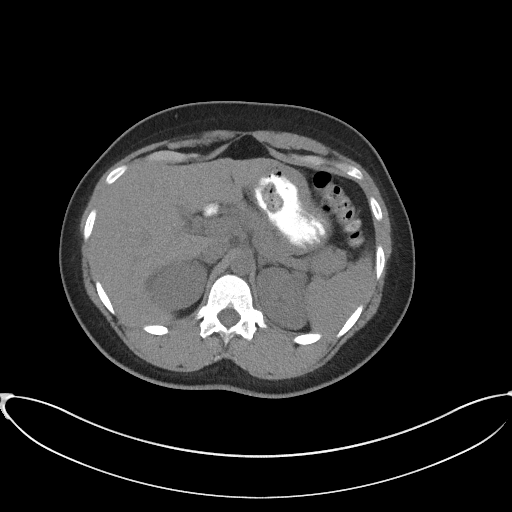
[im 77/88  soft-tissue]
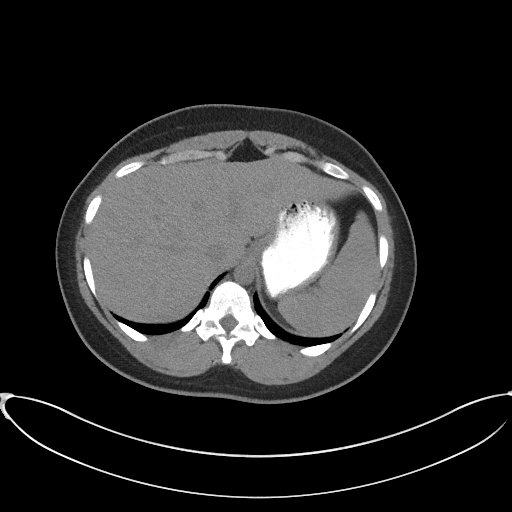
[im 84/88  soft-tissue]
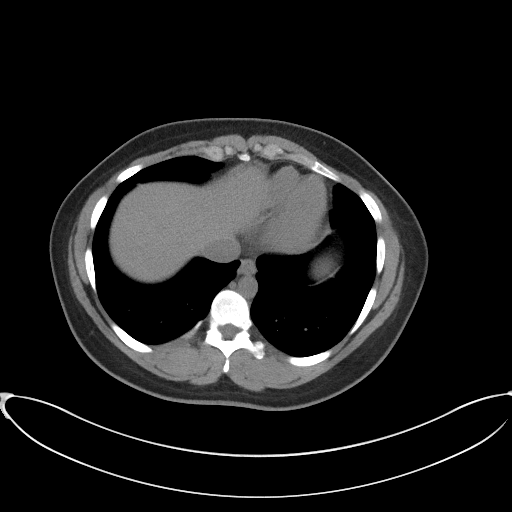

[Series 5: coronal st · coronal · 0.84mm/px · 3 of 91 slices shown]
[im 31/91  soft-tissue]
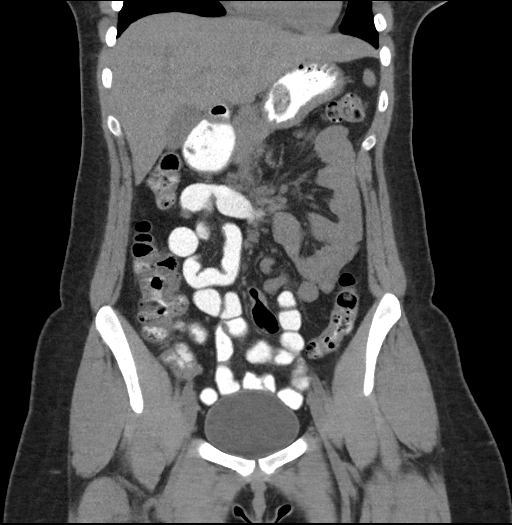
[im 41/91  soft-tissue]
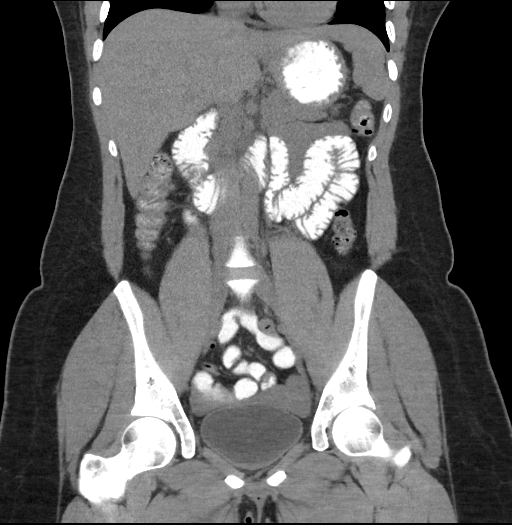
[im 51/91  soft-tissue]
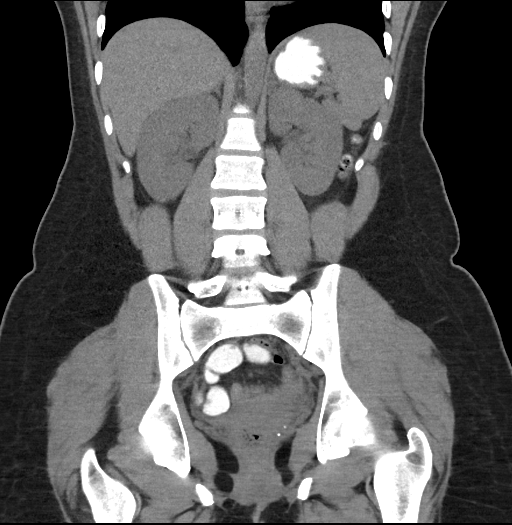

[17 of 46 positions shown; findings below may reference images not displayed]

FINDINGS: Lower chest: Clear lung bases. Normal heart size without pericardial
or pleural effusion.

Hepatobiliary: Normal liver. Normal gallbladder, without biliary
ductal dilatation.

Pancreas: Normal, without mass or ductal dilatation.

Spleen: Normal in size, without focal abnormality.

Adrenals/Urinary Tract: Normal adrenal glands. No renal calculi or
hydronephrosis. No bladder calculi.

Stomach/Bowel: Normal stomach, without wall thickening. Normal colon
and terminal ileum. Normal appendix, including on image 64/series 2.

Normal small bowel.

Vascular/Lymphatic: Normal caliber of the aorta and branch vessels.
No abdominopelvic adenopathy.

Reproductive: Normal uterus and adnexa.

Other: Trace free pelvic fluid is likely physiologic.

Musculoskeletal: No acute osseous abnormality.
IMPRESSION: No acute process in the abdomen or pelvis.
